# Patient Record
Sex: Female | Born: 1975 | Race: White | Hispanic: No | Marital: Married | Smoking: Never smoker
Health system: Southern US, Community
[De-identification: ages and names within clinical notes are randomized; demographics above are authoritative.]

## PROBLEM LIST (undated history)

## (undated) ENCOUNTER — Inpatient Hospital Stay (HOSPITAL_COMMUNITY): Payer: Self-pay

## (undated) DIAGNOSIS — G8929 Other chronic pain: Secondary | ICD-10-CM

## (undated) DIAGNOSIS — I8390 Asymptomatic varicose veins of unspecified lower extremity: Secondary | ICD-10-CM

## (undated) DIAGNOSIS — R51 Headache: Principal | ICD-10-CM

## (undated) DIAGNOSIS — R519 Headache, unspecified: Secondary | ICD-10-CM

## (undated) HISTORY — DX: Asymptomatic varicose veins of unspecified lower extremity: I83.90

## (undated) HISTORY — DX: Other chronic pain: G89.29

## (undated) HISTORY — DX: Headache: R51

## (undated) HISTORY — DX: Headache, unspecified: R51.9

---
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
## 2006-01-14 ENCOUNTER — Inpatient Hospital Stay: Payer: Self-pay | Admitting: Unknown Physician Specialty

## 2007-08-18 ENCOUNTER — Observation Stay: Payer: Self-pay | Admitting: Obstetrics and Gynecology

## 2007-08-24 ENCOUNTER — Inpatient Hospital Stay: Payer: Self-pay

## 2010-05-24 ENCOUNTER — Inpatient Hospital Stay: Payer: Self-pay | Admitting: Obstetrics & Gynecology

## 2011-02-25 ENCOUNTER — Ambulatory Visit (INDEPENDENT_AMBULATORY_CARE_PROVIDER_SITE_OTHER): Payer: Commercial Managed Care - PPO | Admitting: *Deleted

## 2011-02-25 VITALS — BP 110/61 | Temp 98.6°F | Ht 71.0 in | Wt 186.0 lb

## 2011-02-25 DIAGNOSIS — Z3480 Encounter for supervision of other normal pregnancy, unspecified trimester: Secondary | ICD-10-CM

## 2011-02-25 NOTE — Progress Notes (Signed)
NOB intake with pt.  Pt is declining genetic testing at this point.  The only c/o she has is with all of her pregnancies she has an increase in headaches.  Prenatal labs drawn today per protocol and bedside u/s showed positive FHT and CRl measures  at 12wk 2 days.  Pt to return next week for her prenatal exam.

## 2011-02-26 LAB — OBSTETRIC PANEL
Antibody Screen: NEGATIVE
Basophils Relative: 0 % (ref 0–1)
Eosinophils Absolute: 0.1 10*3/uL (ref 0.0–0.7)
Eosinophils Relative: 1 % (ref 0–5)
Hemoglobin: 12.9 g/dL (ref 12.0–15.0)
Hepatitis B Surface Ag: NEGATIVE
Lymphs Abs: 1.6 10*3/uL (ref 0.7–4.0)
MCH: 29.7 pg (ref 26.0–34.0)
MCHC: 33 g/dL (ref 30.0–36.0)
MCV: 89.9 fL (ref 78.0–100.0)
Monocytes Absolute: 0.4 10*3/uL (ref 0.1–1.0)
Monocytes Relative: 6 % (ref 3–12)
Neutrophils Relative %: 70 % (ref 43–77)
Rh Type: POSITIVE

## 2011-02-26 LAB — HIV ANTIBODY (ROUTINE TESTING W REFLEX): HIV: NONREACTIVE

## 2011-03-01 LAB — CULTURE, URINE COMPREHENSIVE: Colony Count: 3000

## 2011-03-05 ENCOUNTER — Ambulatory Visit (INDEPENDENT_AMBULATORY_CARE_PROVIDER_SITE_OTHER): Payer: Commercial Managed Care - PPO | Admitting: Family

## 2011-03-05 DIAGNOSIS — G8929 Other chronic pain: Secondary | ICD-10-CM

## 2011-03-05 DIAGNOSIS — Z3480 Encounter for supervision of other normal pregnancy, unspecified trimester: Secondary | ICD-10-CM

## 2011-03-05 DIAGNOSIS — R51 Headache: Secondary | ICD-10-CM

## 2011-03-05 DIAGNOSIS — R519 Headache, unspecified: Secondary | ICD-10-CM | POA: Insufficient documentation

## 2011-03-05 DIAGNOSIS — IMO0002 Reserved for concepts with insufficient information to code with codable children: Secondary | ICD-10-CM

## 2011-03-05 DIAGNOSIS — I8390 Asymptomatic varicose veins of unspecified lower extremity: Secondary | ICD-10-CM

## 2011-03-05 DIAGNOSIS — O09529 Supervision of elderly multigravida, unspecified trimester: Secondary | ICD-10-CM

## 2011-03-05 DIAGNOSIS — Z1272 Encounter for screening for malignant neoplasm of vagina: Secondary | ICD-10-CM

## 2011-03-05 DIAGNOSIS — Z113 Encounter for screening for infections with a predominantly sexual mode of transmission: Secondary | ICD-10-CM

## 2011-03-05 MED ORDER — BUTALBITAL-APAP-CAFFEINE 50-325-40 MG PO TABS: 1.0000 | ORAL_TABLET | Freq: Four times a day (QID) | ORAL | Status: DC | PRN

## 2011-03-05 NOTE — Progress Notes (Signed)
Exam  Reviewed genetic screening>declined; prefers to get a detailed ultrasound.  Uterine Size: size equals dates  Pelvic Exam:    Perineum: Hemorrhoids, Normal Perineum   Vulva: normal   Vagina:  normal mucosa, normal discharge, no palpable nodules   pH: Not done   Cervix: no bleeding following Pap, no cervical motion tenderness and no lesions   Adnexa: normal adnexa and no mass, fullness, tenderness   Bony Pelvis: Adequate  System: Breast:  No nipple retraction or dimpling, No nipple discharge or bleeding, No axillary or supraclavicular adenopathy, Normal to palpation without dominant masses   Skin: normal coloration and turgor, no rashes    Neurologic: negative   Extremities: normal strength, tone, and muscle mass   HEENT neck supple with midline trachea and thyroid without masses   Mouth/Teeth mucous membranes moist, pharynx normal without lesions   Neck supple and no masses   Cardiovascular: regular rate and rhythm, no murmurs or gallops   Respiratory:  appears well, vitals normal, no respiratory distress, acyanotic, normal RR, neck free of mass or lymphadenopathy, chest clear, no wheezing, crepitations, rhonchi, normal symmetric air entry   Abdomen: soft, non-tender; bowel sounds normal; no masses,  no organomegaly   Urinary: urethral meatus normal

## 2011-03-12 ENCOUNTER — Telehealth: Payer: Self-pay | Admitting: Emergency Medicine

## 2011-03-12 NOTE — Telephone Encounter (Signed)
Spoke with [name].  She states that she began with head cold like symptoms on Tuesday.  She has sinus congestion, teeth hurt.  She has had headache since Wednesday that will not get better.  She states that she has tried taking Tylenol which did not help.  She did take Excedrin which was the only thing that helped her HA temporarily.  She denies any cough or fevers.  She has not really been able to get much to come out.  Would like to know if CNM will call in abx for ?sinus infection.  Per [place], likely viral at this point.  [place] recommends ibuprofen 600mg  q 6 hrs PRN headache, Mucinex BID and increase water intake.  If symptoms no better by Monday, Katie Hurley is to call back for appointment.  She is agreeable to instructions and verbalizes understanding.

## 2011-03-17 ENCOUNTER — Telehealth: Payer: Self-pay | Admitting: Emergency Medicine

## 2011-03-17 NOTE — Telephone Encounter (Signed)
Spoke with pt.  She states that her sinus problems are continuing.  She has been taking Mucinex as directed per [place], [place].  She states Mucinex seems to dry out her sinuses.  She is able to get more out than she was.  Mucous is yellowish.  Advised her to try saline nose spray, common cold can last 7-10 days.  Cautioned on s/s of infection.  Offered appt tomorrow pm, but pt declines for now.  "Will wait it out two weeks and see how I feel".

## 2011-03-18 ENCOUNTER — Ambulatory Visit: Payer: Commercial Managed Care - PPO | Admitting: Physician Assistant

## 2011-03-18 MED ORDER — FLUTICASONE PROPIONATE 50 MCG/ACT NA SUSP: 2.0000 | Freq: Every day | NASAL | Status: DC

## 2011-03-18 MED ORDER — PENICILLIN V POTASSIUM 500 MG PO TABS: 500.0000 mg | ORAL_TABLET | Freq: Two times a day (BID) | ORAL | Status: AC

## 2011-03-18 NOTE — Progress Notes (Signed)
P=77  Sinus pressure and teeth achey  T=95.8

## 2011-04-09 ENCOUNTER — Ambulatory Visit (INDEPENDENT_AMBULATORY_CARE_PROVIDER_SITE_OTHER): Payer: Commercial Managed Care - PPO | Admitting: Family

## 2011-04-09 VITALS — BP 113/62 | Temp 98.5°F | Wt 196.0 lb

## 2011-04-09 DIAGNOSIS — O98919 Unspecified maternal infectious and parasitic disease complicating pregnancy, unspecified trimester: Secondary | ICD-10-CM

## 2011-04-09 DIAGNOSIS — Z3480 Encounter for supervision of other normal pregnancy, unspecified trimester: Secondary | ICD-10-CM

## 2011-04-09 DIAGNOSIS — IMO0002 Reserved for concepts with insufficient information to code with codable children: Secondary | ICD-10-CM

## 2011-04-09 NOTE — Progress Notes (Signed)
Doing well; no questions or concerns; sinuses have improved; "lot more round ligament pain'; will schedule ultrasound

## 2011-04-09 NOTE — Progress Notes (Signed)
Increase in round ligament pain  p-87

## 2011-04-15 ENCOUNTER — Ambulatory Visit (HOSPITAL_COMMUNITY)
Admission: RE | Admit: 2011-04-15 | Discharge: 2011-04-15 | Disposition: A | Discharge status: Home / Self Care | Payer: Commercial Managed Care - PPO | Source: Ambulatory Visit | Attending: Obstetrics & Gynecology | Admitting: Obstetrics & Gynecology

## 2011-04-15 DIAGNOSIS — Z363 Encounter for antenatal screening for malformations: Secondary | ICD-10-CM | POA: Insufficient documentation

## 2011-04-15 DIAGNOSIS — O09529 Supervision of elderly multigravida, unspecified trimester: Secondary | ICD-10-CM | POA: Insufficient documentation

## 2011-04-15 DIAGNOSIS — Z1389 Encounter for screening for other disorder: Secondary | ICD-10-CM | POA: Insufficient documentation

## 2011-04-15 DIAGNOSIS — O358XX0 Maternal care for other (suspected) fetal abnormality and damage, not applicable or unspecified: Secondary | ICD-10-CM | POA: Insufficient documentation

## 2011-04-15 DIAGNOSIS — O98919 Unspecified maternal infectious and parasitic disease complicating pregnancy, unspecified trimester: Secondary | ICD-10-CM

## 2011-05-07 ENCOUNTER — Encounter: Payer: Self-pay | Admitting: Advanced Practice Midwife

## 2011-05-07 ENCOUNTER — Ambulatory Visit (INDEPENDENT_AMBULATORY_CARE_PROVIDER_SITE_OTHER): Payer: Commercial Managed Care - PPO | Admitting: Advanced Practice Midwife

## 2011-05-07 DIAGNOSIS — I8390 Asymptomatic varicose veins of unspecified lower extremity: Secondary | ICD-10-CM

## 2011-05-07 DIAGNOSIS — Z3480 Encounter for supervision of other normal pregnancy, unspecified trimester: Secondary | ICD-10-CM

## 2011-05-07 NOTE — Progress Notes (Signed)
p-82  Leg pain due to varicosities

## 2011-05-07 NOTE — Progress Notes (Signed)
Doing well except increased discomfort with varicosity on right leg and thigh. Encouraged use of support hose and elevation of legs. Discussed fetal movement and PTL precautions.

## 2011-06-04 ENCOUNTER — Ambulatory Visit (INDEPENDENT_AMBULATORY_CARE_PROVIDER_SITE_OTHER): Payer: Commercial Managed Care - PPO | Admitting: Family

## 2011-06-04 VITALS — BP 109/63 | Temp 97.6°F | Wt 209.0 lb

## 2011-06-04 DIAGNOSIS — IMO0002 Reserved for concepts with insufficient information to code with codable children: Secondary | ICD-10-CM

## 2011-06-04 DIAGNOSIS — O09529 Supervision of elderly multigravida, unspecified trimester: Secondary | ICD-10-CM

## 2011-06-04 DIAGNOSIS — Z3480 Encounter for supervision of other normal pregnancy, unspecified trimester: Secondary | ICD-10-CM

## 2011-06-04 MED ORDER — CYCLOBENZAPRINE HCL 10 MG PO TABS: 10.0000 mg | ORAL_TABLET | Freq: Three times a day (TID) | ORAL | Status: AC | PRN

## 2011-06-04 NOTE — Progress Notes (Signed)
Reports increased back pain possibly from car ride over Thanksgiving; no UTI symptoms; desires possible induction at 40.3wks due to the increased pain she has with varicose veins; right leg varicose vein increasing in size>RX for compression hose; Flexeril for back pain; will come back for 1 hr GCT and labs.

## 2011-06-04 NOTE — Progress Notes (Signed)
p-84  Needs a RX for compression hose.  C/O's lower back pain. Urine is clear

## 2011-06-11 ENCOUNTER — Other Ambulatory Visit: Payer: Commercial Managed Care - PPO

## 2011-06-11 ENCOUNTER — Telehealth: Payer: Self-pay | Admitting: *Deleted

## 2011-06-11 DIAGNOSIS — J329 Chronic sinusitis, unspecified: Secondary | ICD-10-CM

## 2011-06-11 MED ORDER — AMOXICILLIN 500 MG PO CAPS: 500.0000 mg | ORAL_CAPSULE | Freq: Three times a day (TID) | ORAL | Status: AC

## 2011-06-11 NOTE — Telephone Encounter (Signed)
Pt called with c/o's sinus infection, blowing green and yellow mucus.  Spoke with Deirdre Poe CNM who ordered Amoxicillin 500mg  TID x 7 days.  This was sent to [facility].

## 2011-06-18 ENCOUNTER — Ambulatory Visit (INDEPENDENT_AMBULATORY_CARE_PROVIDER_SITE_OTHER): Payer: Commercial Managed Care - PPO | Admitting: Family

## 2011-06-18 VITALS — BP 117/75 | Temp 97.0°F | Wt 210.0 lb

## 2011-06-18 DIAGNOSIS — Z3480 Encounter for supervision of other normal pregnancy, unspecified trimester: Secondary | ICD-10-CM

## 2011-06-18 LAB — CBC
HCT: 36.5 % (ref 36.0–46.0)
MCHC: 35.6 g/dL (ref 30.0–36.0)
MCV: 90.1 fL (ref 78.0–100.0)
Platelets: 218 10*3/uL (ref 150–400)
RDW: 14.2 % (ref 11.5–15.5)
WBC: 8.1 10*3/uL (ref 4.0–10.5)

## 2011-06-18 NOTE — Progress Notes (Signed)
p-100 Pt did get her compression hose which are helping.

## 2011-06-18 NOTE — Progress Notes (Signed)
Purchased support hose, reports increased comfort and decreased swelling; asked regarding possible induction due to pain>consulted with Dr. Jearld Adjutant vein pain is not indication for IOL>pt informed; 1 hr GCT and 3rd trimester labs today.

## 2011-06-19 LAB — HIV ANTIBODY (ROUTINE TESTING W REFLEX): HIV: NONREACTIVE

## 2011-06-19 LAB — GLUCOSE TOLERANCE, 1 HOUR: Glucose, 1 Hour GTT: 55 mg/dL — ABNORMAL LOW (ref 70–140)

## 2011-07-01 ENCOUNTER — Ambulatory Visit (INDEPENDENT_AMBULATORY_CARE_PROVIDER_SITE_OTHER): Payer: Commercial Managed Care - PPO | Admitting: Physician Assistant

## 2011-07-01 DIAGNOSIS — Z3480 Encounter for supervision of other normal pregnancy, unspecified trimester: Secondary | ICD-10-CM

## 2011-07-01 NOTE — Progress Notes (Signed)
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 No s/s DVT, precautions reviewed.

## 2011-07-01 NOTE — Patient Instructions (Signed)
Pregnancy - Third Trimester The third trimester of pregnancy (the last 3 months) is a period of the most rapid growth for you and your baby. The baby approaches a length of 20 inches and a weight of 6 to 10 pounds. The baby is adding on fat and getting ready for life outside your body. While inside, babies have periods of sleeping and waking, suck their thumbs, and hiccups. You can often feel small contractions of the uterus. This is false labor. It is also called Braxton-Hicks contractions. This is like a practice for labor. The usual problems in this stage of pregnancy include more difficulty breathing, swelling of the hands and feet from water retention, and having to urinate more often because of the uterus and baby pressing on your bladder.  PRENATAL EXAMS  Blood work may continue to be done during prenatal exams. These tests are done to check on your health and the probable health of your baby. Blood work is used to follow your blood levels (hemoglobin). Anemia (low hemoglobin) is common during pregnancy. Iron and vitamins are given to help prevent this. You may also continue to be checked for diabetes. Some of the past blood tests may be done again.   The size of the uterus is measured during each visit. This makes sure your baby is growing properly according to your pregnancy dates.   Your blood pressure is checked every prenatal visit. This is to make sure you are not getting toxemia.   Your urine is checked every prenatal visit for infection, diabetes and protein.   Your weight is checked at each visit. This is done to make sure gains are happening at the suggested rate and that you and your baby are growing normally.   Sometimes, an ultrasound is performed to confirm the position and the proper growth and development of the baby. This is a test done that bounces harmless sound waves off the baby so your caregiver can more accurately determine due dates.   Discuss the type of pain  medication and anesthesia you will have during your labor and delivery.   Discuss the possibility and anesthesia if a Cesarean Section might be necessary.   Inform your caregiver if there is any mental or physical violence at home.  Sometimes, a specialized non-stress test, contraction stress test and biophysical profile are done to make sure the baby is not having a problem. Checking the amniotic fluid surrounding the baby is called an amniocentesis. The amniotic fluid is removed by sticking a needle into the belly (abdomen). This is sometimes done near the end of pregnancy if an early delivery is required. In this case, it is done to help make sure the baby's lungs are mature enough for the baby to live outside of the womb. If the lungs are not mature and it is unsafe to deliver the baby, an injection of cortisone medication is given to the mother 1 to 2 days before the delivery. This helps the baby's lungs mature and makes it safer to deliver the baby. CHANGES OCCURING IN THE THIRD TRIMESTER OF PREGNANCY Your body goes through many changes during pregnancy. They vary from person to person. Talk to your caregiver about changes you notice and are concerned about.  During the last trimester, you have probably had an increase in your appetite. It is normal to have cravings for certain foods. This varies from person to person and pregnancy to pregnancy.   You may begin to get stretch marks on your hips,   abdomen, and breasts. These are normal changes in the body during pregnancy. There are no exercises or medications to take which prevent this change.   Constipation may be treated with a stool softener or adding bulk to your diet. Drinking lots of fluids, fiber in vegetables, fruits, and whole grains are helpful.   Exercising is also helpful. If you have been very active up until your pregnancy, most of these activities can be continued during your pregnancy. If you have been less active, it is helpful  to start an exercise program such as walking. Consult your caregiver before starting exercise programs.   Avoid all smoking, alcohol, un-prescribed drugs, herbs and "street drugs" during your pregnancy. These chemicals affect the formation and growth of the baby. Avoid chemicals throughout the pregnancy to ensure the delivery of a healthy infant.   Backache, varicose veins and hemorrhoids may develop or get worse.   You will tire more easily in the third trimester, which is normal.   The baby's movements may be stronger and more often.   You may become short of breath easily.   Your belly button may stick out.   A yellow discharge may leak from your breasts called colostrum.   You may have a bloody mucus discharge. This usually occurs a few days to a week before labor begins.  HOME CARE INSTRUCTIONS   Keep your caregiver's appointments. Follow your caregiver's instructions regarding medication use, exercise, and diet.   During pregnancy, you are providing food for you and your baby. Continue to eat regular, well-balanced meals. Choose foods such as meat, fish, milk and other low fat dairy products, vegetables, fruits, and whole-grain breads and cereals. Your caregiver will tell you of the ideal weight gain.   A physical sexual relationship may be continued throughout pregnancy if there are no other problems such as early (premature) leaking of amniotic fluid from the membranes, vaginal bleeding, or belly (abdominal) pain.   Exercise regularly if there are no restrictions. Check with your caregiver if you are unsure of the safety of your exercises. Greater weight gain will occur in the last 2 trimesters of pregnancy. Exercising helps:   Control your weight.   Get you in shape for labor and delivery.   You lose weight after you deliver.   Rest a lot with legs elevated, or as needed for leg cramps or low back pain.   Wear a good support or jogging bra for breast tenderness during  pregnancy. This may help if worn during sleep. Pads or tissues may be used in the bra if you are leaking colostrum.   Do not use hot tubs, steam rooms, or saunas.   Wear your seat belt when driving. This protects you and your baby if you are in an accident.   Avoid raw meat, cat litter boxes and soil used by cats. These carry germs that can cause birth defects in the baby.   It is easier to loose urine during pregnancy. Tightening up and strengthening the pelvic muscles will help with this problem. You can practice stopping your urination while you are going to the bathroom. These are the same muscles you need to strengthen. It is also the muscles you would use if you were trying to stop from passing gas. You can practice tightening these muscles up 10 times a set and repeating this about 3 times per day. Once you know what muscles to tighten up, do not perform these exercises during urination. It is more likely   to cause an infection by backing up the urine.   Ask for help if you have financial, counseling or nutritional needs during pregnancy. Your caregiver will be able to offer counseling for these needs as well as refer you for other special needs.   Make a list of emergency phone numbers and have them available.   Plan on getting help from family or friends when you go home from the hospital.   Make a trial run to the hospital.   Take prenatal classes with the father to understand, practice and ask questions about the labor and delivery.   Prepare the baby's room/nursery.   Do not travel out of the city unless it is absolutely necessary and with the advice of your caregiver.   Wear only low or no heal shoes to have better balance and prevent falling.  MEDICATIONS AND DRUG USE IN PREGNANCY  Take prenatal vitamins as directed. The vitamin should contain 1 milligram of folic acid. Keep all vitamins out of reach of children. Only a couple vitamins or tablets containing iron may be fatal  to a baby or young child when ingested.   Avoid use of all medications, including herbs, over-the-counter medications, not prescribed or suggested by your caregiver. Only take over-the-counter or prescription medicines for pain, discomfort, or fever as directed by your caregiver. Do not use aspirin, ibuprofen (Motrin, Advil, Nuprin) or naproxen (Aleve) unless OK'd by your caregiver.   Let your caregiver also know about herbs you may be using.   Alcohol is related to a number of birth defects. This includes fetal alcohol syndrome. All alcohol, in any form, should be avoided completely. Smoking will cause low birth rate and premature babies.   Street/illegal drugs are very harmful to the baby. They are absolutely forbidden. A baby born to an addicted mother will be addicted at birth. The baby will go through the same withdrawal an adult does.  SEEK MEDICAL CARE IF: You have any concerns or worries during your pregnancy. It is better to call with your questions if you feel they cannot wait, rather than worry about them. DECISIONS ABOUT CIRCUMCISION You may or may not know the sex of your baby. If you know your baby is a boy, it may be time to think about circumcision. Circumcision is the removal of the foreskin of the penis. This is the skin that covers the sensitive end of the penis. There is no proven medical need for this. Often this decision is made on what is popular at the time or based upon religious beliefs and social issues. You can discuss these issues with your caregiver or pediatrician. SEEK IMMEDIATE MEDICAL CARE IF:   An unexplained oral temperature above 102 F (38.9 C) develops, or as your caregiver suggests.   You have leaking of fluid from the vagina (birth canal). If leaking membranes are suspected, take your temperature and tell your caregiver of this when you call.   There is vaginal spotting, bleeding or passing clots. Tell your caregiver of the amount and how many pads are  used.   You develop a bad smelling vaginal discharge with a change in the color from clear to white.   You develop vomiting that lasts more than 24 hours.   You develop chills or fever.   You develop shortness of breath.   You develop burning on urination.   You loose more than 2 pounds of weight or gain more than 2 pounds of weight or as suggested by your   caregiver.   You notice sudden swelling of your face, hands, and feet or legs.   You develop belly (abdominal) pain. Round ligament discomfort is a common non-cancerous (benign) cause of abdominal pain in pregnancy. Your caregiver still must evaluate you.   You develop a severe headache that does not go away.   You develop visual problems, blurred or double vision.   If you have not felt your baby move for more than 1 hour. If you think the baby is not moving as much as usual, eat something with sugar in it and lie down on your left side for an hour. The baby should move at least 4 to 5 times per hour. Call right away if your baby moves less than that.   You fall, are in a car accident or any kind of trauma.   There is mental or physical violence at home.  Document Released: 06/15/2001 Document Revised: 03/03/2011 Document Reviewed: 12/18/2008 Exit[employer] Patient Information 2012 [employer]. 

## 2011-07-06 NOTE — L&D Delivery Note (Signed)
Delivery Note At 8:56 AM a viable female was delivered via Vaginal, Spontaneous Delivery (Presentation: Left Occiput Anterior).  APGAR: 8, 8; weight 9 lb 13 oz (4451 g).   Placenta status: Intact, Spontaneous.  Cord: 3 vessels with the following complications: Long, body cord.    Mild postpartum hemorrhage - cytotec PO given.  Anesthesia: Epidural  Episiotomy: None Lacerations: None Est. Blood Loss (mL): 800  Mom to postpartum.  Baby to nursery-stable.  Melven Stockard JEHIEL 09/09/2011, 11:46 AM

## 2011-07-26 ENCOUNTER — Ambulatory Visit (INDEPENDENT_AMBULATORY_CARE_PROVIDER_SITE_OTHER): Payer: Commercial Managed Care - PPO | Admitting: Advanced Practice Midwife

## 2011-07-26 ENCOUNTER — Encounter: Payer: Self-pay | Admitting: Advanced Practice Midwife

## 2011-07-26 VITALS — BP 113/70 | Temp 87.2°F | Wt 215.0 lb

## 2011-07-26 DIAGNOSIS — O09529 Supervision of elderly multigravida, unspecified trimester: Secondary | ICD-10-CM

## 2011-07-26 MED ORDER — TRIAMCINOLONE ACETONIDE 0.5 % EX OINT: TOPICAL_OINTMENT | Freq: Two times a day (BID) | CUTANEOUS | Status: DC

## 2011-07-26 NOTE — Progress Notes (Signed)
Well except itchy rash on right arm and left elbow, multiple small excoriated papular lesions, aquaphor helps a little. Rx triamcinolone cream. Rev'd precautions, GBS at next visit.

## 2011-07-26 NOTE — Patient Instructions (Signed)
Pregnancy - Third Trimester The third trimester begins at the 28th week of pregnancy and ends at birth. It is important to follow your doctor's instructions. HOME CARE   Go to your doctor's visits.   Do not smoke.   Do not drink alcohol or use drugs.   Only take medicine as told by your doctor.   Take prenatal vitamins as told. The vitamin should contain 1 milligram of folic acid.   Exercise.   Eat healthy foods. Eat regular, well-balanced meals.   You can have sex (intercourse) if there are no other problems with the pregnancy.   Do not use hot tubs, steam rooms, or saunas.   Wear a seat belt while driving.   Avoid raw meat, uncooked cheese, and litter boxes and soil used by cats.   Rest with your legs raised (elevated).   Make a list of emergency phone numbers. Keep this list with you.   Arrange for help when you come back home after delivering the baby.   Make a trial run to the hospital.   Take prenatal classes.   Prepare the baby's nursery.   Do not travel out of the city. If you absolutely have to, get permission from your doctor first.   Wear flat shoes. Do not wear high heels.  GET HELP RIGHT AWAY IF:   You have a temperature by mouth above 102 F (38.9 C), not controlled by medicine.   You have not felt the baby move for more than 1 hour. If you think the baby is not moving as much as normal, eat something with sugar in it or lie down on your left side for an hour. The baby should move at least 4 to 5 times per hour.   Fluid is coming from the vagina.   Blood is coming from the vagina. Light spotting is common, especially after sex (intercourse).   You have belly (abdominal) pain.   You have a bad smelling fluid (discharge) coming from the vagina. The fluid changes from clear to white.   You still feel sick to your stomach (nauseous).   You throw up (vomit) for more than 24 hours.   You have the chills.   You have shortness of breath.   You  have a burning feeling when you pee (urinate).   You lose or gain more than 2 pounds (0.9 kilograms) of weight over a week, or as told by your doctor.   Your face, hands, feet, or legs get puffy (swell).   You have a bad headache that will not go away.   You start to have problems seeing (blurry or double vision).   You fall, are in a car accident, or have any kind of trauma.   There is mental or physical violence at home.   You have any concerns or worries during your pregnancy.  MAKE SURE YOU:   Understand these instructions.   Will watch your condition.   Will get help right away if you are not doing well or get worse.  Document Released: 09/15/2009 Document Revised: 03/03/2011 Document Reviewed: 09/15/2009 Exit[employer] Patient Information 2012 [employer]. 

## 2011-07-26 NOTE — Progress Notes (Signed)
p-82  Pt requesting something for the itchy rash on rt forearm that has only been there since pregancy

## 2011-08-09 ENCOUNTER — Ambulatory Visit (INDEPENDENT_AMBULATORY_CARE_PROVIDER_SITE_OTHER): Payer: Commercial Managed Care - PPO | Admitting: Advanced Practice Midwife

## 2011-08-09 VITALS — BP 121/69 | Temp 97.0°F | Wt 219.0 lb

## 2011-08-09 DIAGNOSIS — Z3490 Encounter for supervision of normal pregnancy, unspecified, unspecified trimester: Secondary | ICD-10-CM

## 2011-08-09 DIAGNOSIS — Z3480 Encounter for supervision of other normal pregnancy, unspecified trimester: Secondary | ICD-10-CM

## 2011-08-09 LAB — GC/CHLAMYDIA PROBE AMP, GENITAL: Chlamydia: NEGATIVE

## 2011-08-09 LAB — STREP B DNA PROBE: GBS: NEGATIVE

## 2011-08-09 NOTE — Patient Instructions (Signed)
Pregnancy - Third Trimester The third trimester of pregnancy (the last 3 months) is a period of the most rapid growth for you and your baby. The baby approaches a length of 20 inches and a weight of 6 to 10 pounds. The baby is adding on fat and getting ready for life outside your body. While inside, babies have periods of sleeping and waking, suck their thumbs, and hiccups. You can often feel small contractions of the uterus. This is false labor. It is also called Braxton-Hicks contractions. This is like a practice for labor. The usual problems in this stage of pregnancy include more difficulty breathing, swelling of the hands and feet from water retention, and having to urinate more often because of the uterus and baby pressing on your bladder.  PRENATAL EXAMS  Blood work may continue to be done during prenatal exams. These tests are done to check on your health and the probable health of your baby. Blood work is used to follow your blood levels (hemoglobin). Anemia (low hemoglobin) is common during pregnancy. Iron and vitamins are given to help prevent this. You may also continue to be checked for diabetes. Some of the past blood tests may be done again.   The size of the uterus is measured during each visit. This makes sure your baby is growing properly according to your pregnancy dates.   Your blood pressure is checked every prenatal visit. This is to make sure you are not getting toxemia.   Your urine is checked every prenatal visit for infection, diabetes and protein.   Your weight is checked at each visit. This is done to make sure gains are happening at the suggested rate and that you and your baby are growing normally.   Sometimes, an ultrasound is performed to confirm the position and the proper growth and development of the baby. This is a test done that bounces harmless sound waves off the baby so your caregiver can more accurately determine due dates.   Discuss the type of pain  medication and anesthesia you will have during your labor and delivery.   Discuss the possibility and anesthesia if a Cesarean Section might be necessary.   Inform your caregiver if there is any mental or physical violence at home.  Sometimes, a specialized non-stress test, contraction stress test and biophysical profile are done to make sure the baby is not having a problem. Checking the amniotic fluid surrounding the baby is called an amniocentesis. The amniotic fluid is removed by sticking a needle into the belly (abdomen). This is sometimes done near the end of pregnancy if an early delivery is required. In this case, it is done to help make sure the baby's lungs are mature enough for the baby to live outside of the womb. If the lungs are not mature and it is unsafe to deliver the baby, an injection of cortisone medication is given to the mother 1 to 2 days before the delivery. This helps the baby's lungs mature and makes it safer to deliver the baby. CHANGES OCCURING IN THE THIRD TRIMESTER OF PREGNANCY Your body goes through many changes during pregnancy. They vary from person to person. Talk to your caregiver about changes you notice and are concerned about.  During the last trimester, you have probably had an increase in your appetite. It is normal to have cravings for certain foods. This varies from person to person and pregnancy to pregnancy.   You may begin to get stretch marks on your hips,   abdomen, and breasts. These are normal changes in the body during pregnancy. There are no exercises or medications to take which prevent this change.   Constipation may be treated with a stool softener or adding bulk to your diet. Drinking lots of fluids, fiber in vegetables, fruits, and whole grains are helpful.   Exercising is also helpful. If you have been very active up until your pregnancy, most of these activities can be continued during your pregnancy. If you have been less active, it is helpful  to start an exercise program such as walking. Consult your caregiver before starting exercise programs.   Avoid all smoking, alcohol, un-prescribed drugs, herbs and "street drugs" during your pregnancy. These chemicals affect the formation and growth of the baby. Avoid chemicals throughout the pregnancy to ensure the delivery of a healthy infant.   Backache, varicose veins and hemorrhoids may develop or get worse.   You will tire more easily in the third trimester, which is normal.   The baby's movements may be stronger and more often.   You may become short of breath easily.   Your belly button may stick out.   A yellow discharge may leak from your breasts called colostrum.   You may have a bloody mucus discharge. This usually occurs a few days to a week before labor begins.  HOME CARE INSTRUCTIONS   Keep your caregiver's appointments. Follow your caregiver's instructions regarding medication use, exercise, and diet.   During pregnancy, you are providing food for you and your baby. Continue to eat regular, well-balanced meals. Choose foods such as meat, fish, milk and other low fat dairy products, vegetables, fruits, and whole-grain breads and cereals. Your caregiver will tell you of the ideal weight gain.   A physical sexual relationship may be continued throughout pregnancy if there are no other problems such as early (premature) leaking of amniotic fluid from the membranes, vaginal bleeding, or belly (abdominal) pain.   Exercise regularly if there are no restrictions. Check with your caregiver if you are unsure of the safety of your exercises. Greater weight gain will occur in the last 2 trimesters of pregnancy. Exercising helps:   Control your weight.   Get you in shape for labor and delivery.   You lose weight after you deliver.   Rest a lot with legs elevated, or as needed for leg cramps or low back pain.   Wear a good support or jogging bra for breast tenderness during  pregnancy. This may help if worn during sleep. Pads or tissues may be used in the bra if you are leaking colostrum.   Do not use hot tubs, steam rooms, or saunas.   Wear your seat belt when driving. This protects you and your baby if you are in an accident.   Avoid raw meat, cat litter boxes and soil used by cats. These carry germs that can cause birth defects in the baby.   It is easier to loose urine during pregnancy. Tightening up and strengthening the pelvic muscles will help with this problem. You can practice stopping your urination while you are going to the bathroom. These are the same muscles you need to strengthen. It is also the muscles you would use if you were trying to stop from passing gas. You can practice tightening these muscles up 10 times a set and repeating this about 3 times per day. Once you know what muscles to tighten up, do not perform these exercises during urination. It is more likely   to cause an infection by backing up the urine.   Ask for help if you have financial, counseling or nutritional needs during pregnancy. Your caregiver will be able to offer counseling for these needs as well as refer you for other special needs.   Make a list of emergency phone numbers and have them available.   Plan on getting help from family or friends when you go home from the hospital.   Make a trial run to the hospital.   Take prenatal classes with the father to understand, practice and ask questions about the labor and delivery.   Prepare the baby's room/nursery.   Do not travel out of the city unless it is absolutely necessary and with the advice of your caregiver.   Wear only low or no heal shoes to have better balance and prevent falling.  MEDICATIONS AND DRUG USE IN PREGNANCY  Take prenatal vitamins as directed. The vitamin should contain 1 milligram of folic acid. Keep all vitamins out of reach of children. Only a couple vitamins or tablets containing iron may be fatal  to a baby or young child when ingested.   Avoid use of all medications, including herbs, over-the-counter medications, not prescribed or suggested by your caregiver. Only take over-the-counter or prescription medicines for pain, discomfort, or fever as directed by your caregiver. Do not use aspirin, ibuprofen (Motrin, Advil, Nuprin) or naproxen (Aleve) unless OK'd by your caregiver.   Let your caregiver also know about herbs you may be using.   Alcohol is related to a number of birth defects. This includes fetal alcohol syndrome. All alcohol, in any form, should be avoided completely. Smoking will cause low birth rate and premature babies.   Street/illegal drugs are very harmful to the baby. They are absolutely forbidden. A baby born to an addicted mother will be addicted at birth. The baby will go through the same withdrawal an adult does.  SEEK MEDICAL CARE IF: You have any concerns or worries during your pregnancy. It is better to call with your questions if you feel they cannot wait, rather than worry about them. DECISIONS ABOUT CIRCUMCISION You may or may not know the sex of your baby. If you know your baby is a boy, it may be time to think about circumcision. Circumcision is the removal of the foreskin of the penis. This is the skin that covers the sensitive end of the penis. There is no proven medical need for this. Often this decision is made on what is popular at the time or based upon religious beliefs and social issues. You can discuss these issues with your caregiver or pediatrician. SEEK IMMEDIATE MEDICAL CARE IF:   An unexplained oral temperature above 102 F (38.9 C) develops, or as your caregiver suggests.   You have leaking of fluid from the vagina (birth canal). If leaking membranes are suspected, take your temperature and tell your caregiver of this when you call.   There is vaginal spotting, bleeding or passing clots. Tell your caregiver of the amount and how many pads are  used.   You develop a bad smelling vaginal discharge with a change in the color from clear to white.   You develop vomiting that lasts more than 24 hours.   You develop chills or fever.   You develop shortness of breath.   You develop burning on urination.   You loose more than 2 pounds of weight or gain more than 2 pounds of weight or as suggested by your   caregiver.   You notice sudden swelling of your face, hands, and feet or legs.   You develop belly (abdominal) pain. Round ligament discomfort is a common non-cancerous (benign) cause of abdominal pain in pregnancy. Your caregiver still must evaluate you.   You develop a severe headache that does not go away.   You develop visual problems, blurred or double vision.   If you have not felt your baby move for more than 1 hour. If you think the baby is not moving as much as usual, eat something with sugar in it and lie down on your left side for an hour. The baby should move at least 4 to 5 times per hour. Call right away if your baby moves less than that.   You fall, are in a car accident or any kind of trauma.   There is mental or physical violence at home.  Document Released: 06/15/2001 Document Revised: 03/03/2011 Document Reviewed: 12/18/2008 Exit[employer] Patient Information 2012 [employer]. 

## 2011-08-09 NOTE — Progress Notes (Signed)
p=86 

## 2011-08-09 NOTE — Progress Notes (Signed)
Well - did not get rx for triamcinolone, but got 1% hydrocortisone and is not helping, rash not worsening, will try oral antihistamines. GBS and GC/CT done today. Baby very active on initial exam - FHR 180s-190 with lots of movement. Pt also states she took a tylenol with caffeine this morning and has not had much water to drink. NST - initial baseline 170s with + accels to 180s-190s, after PO hydration baseline 140s, moderate variability, reactive NST. Rev'd precautions.

## 2011-08-16 ENCOUNTER — Ambulatory Visit (INDEPENDENT_AMBULATORY_CARE_PROVIDER_SITE_OTHER): Payer: Commercial Managed Care - PPO | Admitting: Family

## 2011-08-16 DIAGNOSIS — Z3480 Encounter for supervision of other normal pregnancy, unspecified trimester: Secondary | ICD-10-CM

## 2011-08-16 NOTE — Progress Notes (Signed)
Parents coming at 41 wks pregnancy to assist with childcare, hopes to wait that long; no questions; continued itching, denies itching on hands and feet, only on arms; will pick up RX today and try Benadryl.  Reviewed GBS & GC/CT results

## 2011-08-16 NOTE — Progress Notes (Signed)
p-96.6

## 2011-08-23 ENCOUNTER — Ambulatory Visit (INDEPENDENT_AMBULATORY_CARE_PROVIDER_SITE_OTHER): Payer: Commercial Managed Care - PPO | Admitting: Physician Assistant

## 2011-08-23 ENCOUNTER — Encounter (HOSPITAL_COMMUNITY): Payer: Self-pay | Admitting: *Deleted

## 2011-08-23 ENCOUNTER — Inpatient Hospital Stay (HOSPITAL_COMMUNITY)
Admission: AD | Admit: 2011-08-23 | Discharge: 2011-08-23 | Disposition: A | Discharge status: Home / Self Care | Payer: Self-pay | Source: Ambulatory Visit | Attending: Family Medicine | Admitting: Family Medicine

## 2011-08-23 DIAGNOSIS — Z3480 Encounter for supervision of other normal pregnancy, unspecified trimester: Secondary | ICD-10-CM

## 2011-08-23 DIAGNOSIS — O479 False labor, unspecified: Secondary | ICD-10-CM

## 2011-08-23 NOTE — Patient Instructions (Signed)
Labor Induction A pregnant woman usually goes into labor spontaneously before the birth of her baby. Most babies are born between 37 and 42 weeks of the pregnancy. When this does not happen, caregivers may use medication or other methods to bring on (induce) labor. Labor induction causes a pregnant woman's uterus to contract, the cervix to open (dilate) and thin out (efface) to prepare for the vaginal birth of her baby. Several methods of labor induction may be used such as:  Massaging the nipple and areola of the breasts (nipple stimulation).   Prostaglandin medication used orally or as a vaginal cream.   Striping of membranes (your caregiver inserts a finger between the cervix and membranes around the baby's head) causes the body to produce prostaglandins that soften the cervix and cause the uterus to contract.   Rupture of the water bag (amniotomy).   Oxytocin by IV.   Special dilators placed into the cervical canal that causes the cervix to soften and open.   Mechanical devices to stretch open the cervix such as, a dilated foley catheter.  Whether your labor will be induced depends on the condition of you and your baby, how far along you are, are the baby's lung maturity, the condition of the cervix, the way the baby is lying, and other factors. Usually, labor is not induced before 39 weeks of the pregnancy unless there is a problem with the baby or mother, and it becomes necessary to induce labor. REASONS LABOR SHOULD BE INDUCED  The health of the baby or mother has become at risk.   The pregnancy is overdue by 2 weeks or more.   Your water breaks (premature rupture of membranes), the baby's lungs are mature, and labor does not start on its own.   You develop high blood pressure (toxemia of pregnancy).   You develop an infection in your uterus.   You have diabetes or other serious medical illness.   Amniotic fluid amounts are small around the baby.   Your placenta begins to  separate from the inner wall of the uterus before the baby is born (placental abruption). This condition may cause you to have an emergency Cesarean delivery.   You have fetal death.   A social induction is also known as an induction for convenience. Most of the time, labor is induced for sound medical reasons. Sometimes, it is done as a convenience. Living a long way from the hospital or having a history of very rapid labors may be reasons the mother may want to induce delivery.  REASONS LABOR SHOULD NOT BE INDUCED  You have had previous surgeries on your uterus. This is especially true if the surgeries went into the inside lining and cavity of the uterus. This gives an added risk for rupturing the uterus.   You have placenta previa. This means your placenta lies very low in the uterus and blocks the opening (cervix) for the baby to get out.   Your baby is not in a head down position. For example, if your baby lies across your uterus (transverse) instead of head first.   If the umbilical cord drops down into the birth canal in front of your baby. This could cut off the baby's blood supply and oxygen to the baby.  RISKS AND COMPLICATIONS Problems seldom occur with labor induction, but there can be some complications. Some of the risks of induction include:  Change in fetal heart rate (too high, too low or irradic).   Increased risk of   a premature baby, even if you think your baby is term.   Increased risk of fetal distress. This means your baby gets into problems during induction. This can be caused by the umbilical cord coming out in front of the baby or is being squeezed.   Increased risk of infection to mother and baby.   Increased chance of having a Cesarean delivery. This is an operation on your belly (abdomen) to remove the baby.   Strong contractions can lead to abruption. This is a separating of the placenta from the uterus.   Uterine rupture, especially if you had a previous  Cesarean or surgery on your uterus.  When labor is induced because of medical problems, other risks may be present. Induced labor may lead to:  Increased use of medications for pain relief.   Other interventions.  When induction is needed for medical reasons, the benefits of induction may outweigh the risks. PROCEDURE It can sometimes take up to 2 or 3 days to induce labor. It usually takes less time. It takes longer when you are induced early in the pregnancy and for first pregnancies.  Before coming to the hospital for an induction:  Do not eat much before you come to the hospital (for at least 8 hours).   Do not eat after midnight if you are going to be induced the next morning.   Be aware that medications for labor induction can upset your stomach.   Let your caregiver know if you need medications for pain.  HOME CARE INSTRUCTIONS If you have been induced in your caregiver's office to start labor, and are allowed to go home, follow the instructions given to you by your care giver. SEEK IMMEDIATE MEDICAL CARE IF:  You develop any kind of vaginal bleeding.   You develop contractions that are severe and continuous.   You feel faint or feel light headed.   You do not develop contractions within the time your caregiver suggests you should.   You begin to run a temperature of 100 F (37.8 C) or develop chills.   You no longer feel the normal fetal movement.  Document Released: 11/10/2006 Document Revised: 03/03/2011 Document Reviewed: 02/28/2009 [employer] Patient Information 2012 [employer]. 

## 2011-08-23 NOTE — Progress Notes (Signed)
Contractions q 3-5 at home

## 2011-08-23 NOTE — Discharge Instructions (Signed)
Normal Labor and Delivery °Your caregiver must first be sure you are in labor. Signs of labor include: °· You may pass what is called "the mucus plug" before labor begins. This is a small amount of blood stained mucus.  °· Regular uterine contractions.  °· The time between contractions get closer together.  °· The discomfort and pain gradually gets more intense.  °· Pains are mostly located in the back.  °· Pains get worse when walking.  °· The cervix (the opening of the uterus becomes thinner (begins to efface) and opens up (dilates).  °Once you are in labor and admitted into the hospital or care center, your caregiver will do the following: °· A complete physical examination.  °· Check your vital signs (blood pressure, pulse, temperature and the fetal heart rate).  °· Do a vaginal examination (using a sterile glove and lubricant) to determine:  °· The position (presentation) of the baby (head [vertex] or buttock first).  °· The level (station) of the baby's head in the birth canal.  °· The effacement and dilatation of the cervix.  °· You may have your pubic hair shaved and be given an enema depending on your caregiver and the circumstance.  °· An electronic monitor is usually placed on your abdomen. The monitor follows the length and intensity of the contractions, as well as the baby's heart rate.  °· Usually, your caregiver will insert an IV in your arm with a bottle of sugar water. This is done as a precaution so that medications can be given to you quickly during labor or delivery.  °NORMAL LABOR AND DELIVERY IS DIVIDED UP INTO 3 STAGES: °First Stage °This is when regular contractions begin and the cervix begins to efface and dilate. This stage can last from 3 to 15 hours. The end of the first stage is when the cervix is 100% effaced and 10 centimeters dilated. Pain medications may be given by  °· Injection (morphine, demerol, etc.)  °· Regional anesthesia (spinal, caudal or epidural, anesthetics given in  different locations of the spine). Paracervical pain medication may be given, which is an injection of and anesthetic on each side of the cervix.  °A pregnant woman may request to have "Natural Childbirth" which is not to have any medications or anesthesia during her labor and delivery. °Second Stage °This is when the baby comes down through the birth canal (vagina) and is born. This can take 1 to 4 hours. As the baby's head comes down through the birth canal, you may feel like you are going to have a bowel movement. You will get the urge to bear down and push until the baby is delivered. As the baby's head is being delivered, the caregiver will decide if an episiotomy (a cut in the perineum and vagina area) is needed to prevent tearing of the tissue in this area. The episiotomy is sewn up after the delivery of the baby and placenta. Sometimes a mask with nitrous oxide is given for the mother to breath during the delivery of the baby to help if there is too much pain. The end of Stage 2 is when the baby is fully delivered. Then when the umbilical cord stops pulsating it is clamped and cut. °Third Stage °The third stage begins after the baby is completely delivered and ends after the placenta (afterbirth) is delivered. This usually takes 5 to 30 minutes. After the placenta is delivered, a medication is given either by intravenous or injection to help contract   the uterus and prevent bleeding. The third stage is not painful and pain medication is usually not necessary. If an episiotomy was done, it is repaired at this time. °After the delivery, the mother is watched and monitored closely for 1 to 2 hours to make sure there is no postpartum bleeding (hemorrhage). If there is a lot of bleeding, medication is given to contract the uterus and stop the bleeding. °Document Released: 03/30/2008 Document Revised: 03/03/2011 Document Reviewed: 03/30/2008 °[employer]® Patient Information ©2012 [employer]. °

## 2011-08-23 NOTE — Progress Notes (Signed)
p-84 

## 2011-08-23 NOTE — Progress Notes (Signed)
No complaints. Varicose veins same. Reviewed IOL at 41 weeks.

## 2011-08-23 NOTE — ED Provider Notes (Signed)
History     Chief Complaint  Patient presents with  . Labor Eval   HPI This is a 36 y.o. at [redacted]w[redacted]d who presents with contractions for labor eval. States UCs were stonger at home. Denies leaking or bleeding and reports + FM.  States it will be difficult to get childcare in middle of night if labor progresses. Worries about what to do with kids if labors.  OB History    Grav Para Term Preterm Abortions TAB SAB Ect Mult Living   6 3 3  2  2   3       Past Medical History  Diagnosis Date  . Chronic headaches     Increases in pregnancy; tx with Fioricet (relief)  . Varicose veins     Right Leg  . Urinary incontinence     After 3rd baby    Past Surgical History  Procedure Date  . Wisdom tooth extraction 1992    Family History  Problem Relation Age of Onset  . Diabetes Paternal Grandmother   . Cancer Maternal Grandfather     prostate and Lung    History  Substance Use Topics  . Smoking status: Never Smoker   . Smokeless tobacco: Never Used  . Alcohol Use: No    Allergies:  Allergies  Allergen Reactions  . Erythromycin Hives    No prescriptions prior to admission    ROS As above.  Physical Exam   Blood pressure 111/72, pulse 97, temperature 97.3 F (36.3 C), resp. rate 18, height 5\' 11"  (1.803 m), weight 224 lb (101.606 kg), last menstrual period 11/26/2010.  Physical Exam  Constitutional: She is oriented to person, place, and time. She appears well-developed and well-nourished.  HENT:  Head: Normocephalic.  Cardiovascular: Normal rate.   Respiratory: Effort normal.  GI: Soft. She exhibits no distension and no mass. There is no tenderness. There is no rebound and no guarding.  Genitourinary: Vagina normal and uterus normal. No vaginal discharge found.       FHR reactive with mild uterine contractions every 2 minutes which last about 30 seconds. Pt able to talk through them, does not look uncomfortable. Cervix   Loose 1/long/-3/vtx/post  Musculoskeletal:  Normal range of motion.  Neurological: She is alert and oriented to person, place, and time.  Skin: Skin is warm and dry.  Psychiatric: She has a normal mood and affect.    MAU Course  Procedures  Assessment and Plan  A:  [facility] at [redacted]w[redacted]d      Prodromal contractions without cervical change (same cx in office) P:  Wants to be kept for induction      Explained there is no clinical indication for induction/augmentation  And that it would be ok to bring kids with her if necessary at night.     Offered recheck in one hour, pt wishes to go home      Labor precautions.   [facility] 08/23/2011, 11:52 PM

## 2011-08-24 DIAGNOSIS — O479 False labor, unspecified: Secondary | ICD-10-CM

## 2011-08-24 NOTE — ED Provider Notes (Signed)
Chart reviewed and agree with management and plan.  

## 2011-08-30 MED ORDER — TRIAMCINOLONE ACETONIDE 0.1 % EX OINT: TOPICAL_OINTMENT | Freq: Two times a day (BID) | CUTANEOUS | Status: DC

## 2011-08-31 ENCOUNTER — Ambulatory Visit (INDEPENDENT_AMBULATORY_CARE_PROVIDER_SITE_OTHER): Payer: Commercial Managed Care - PPO | Admitting: Obstetrics & Gynecology

## 2011-08-31 DIAGNOSIS — Z3480 Encounter for supervision of other normal pregnancy, unspecified trimester: Secondary | ICD-10-CM

## 2011-08-31 NOTE — Progress Notes (Signed)
p-96  Induction scheduled for 3/6 @ 7:30 pm per Dr Penne Lash.  Plan is Pitocin

## 2011-08-31 NOTE — Progress Notes (Signed)
Induction to be scheduled on March 6th--pts parents arrive March 5th. Pt started triamcenalone cream for rash on upper extremities.  No other probs.

## 2011-08-31 NOTE — Patient Instructions (Signed)
Vasectomy A vasectomy is tying (with or without cutting) the tube that collects the sperm from the testicle (vas deferens). The vasectomy blocks the sperm from going through the vas deferens and penis so that during sexual intercourse, the sperm does not go into the vagina. Vasectomy is safe, with very rare complications. It does not affect your sexual desire or performance.  Since vasectomy is considered permanent, you should not have it done until you are sure you do not want any more children. You and your partner should be in full agreement to have the procedure. Your decision to have a vasectomy should not be made during a stressful situation. This includes loss of a pregnancy, illness, death of a spouse or divorce. There are other means of contraception that can be used until you are completely sure you want this procedure done. A vasectomy does not protect you from sexually transmitted disease. Men who want the vasectomy reversed need an operation. It may not be successful. A vasectomy has less risk and is less expensive than a woman having a tubal sterilization. LET YOUR CAREGIVER KNOW ABOUT:   Allergies.   Medications taken including herbs, eye drops, over the counter medications, and creams.   Use of steroids (by mouth or creams).   Past problems with anesthetics or Novocaine.   History of blood clots (thrombophlebitis).   History of bleeding or blood problems.   Past surgery.  RISKS AND COMPLICATIONS  Failure of the procedure to cause infertility. This means you would still be able to get a female pregnant.   Post-operative pain. This can usually be controlled with medicine.   Infection. A germ starts growing in the wound. This can usually be treated with antibiotics.   An allergic reaction to the anesthetic or other medicine given.   Bleeding. Blood may seep under the skin so that the scrotum and penis appear to be bruised. Sometimes the scrotum can swell and get the size of  a grapefruit. This usually disappears without treatment within a week or two.  PROCEDURE  The scrotum is cleaned with bacterial killing soap and the caregiver finds the vas deferens.   Each side of the scrotum is numbed.   A very small cut (incision) is made, and the vas deferens are pulled out of the scrotum. The vas deferens are then tied off, cut or may be burned (cauterized) at the ends.   Sometimes the vas deferens are pulled out from the scrotum through a puncture wound. This is done with a special instrument without an incision.   The vas deferens are then put back into the scrotum, and the incision or puncture wound is closed.   After surgery, sperm may still be left in the vas deferens for 1 to 3 months. Because of this, other means of contraception should be used until your caregiver examines you and finds there are no sperm in your seminal fluid.   Castration is another method that is the surgical removal of both testicles. It causes sterilization in men.  Having a vasectomy should be discussed with your care giver with you and your partner present. Ask questions and talk about your concerns with your caregiver. Then, you can decide if the operation is safe for you. You can change your mind and cancel the surgery at any time. HOME CARE INSTRUCTIONS   Only take over-the-counter or prescription medicines for pain, discomfort or fever as directed by your caregiver.   An ice pack for 15 to 20 minutes,  3 to 4 times per day will help decrease swelling.   Wearing supportive briefs or an athletic supporter will help decrease swelling.   Avoid being active for the first two days after surgery.   Keep the incisions clean and covered to prevent infection.   Some oozing of blood from the cut (incision) is normal during the first day or two after surgery.   Do not participate in sports or do heavy physical labor for at least two weeks.   Follow your caregivers instructions regarding  diet, rest, work, social and sexual activities and follow up appointments.  SEEK IMMEDIATE MEDICAL CARE IF:   There is redness, swelling, or increasing pain in the wounds or testicles.   Pus is coming from wound.   An unexplained oral temperature above 102 F (38.9 C) develops, or as directed by your caregiver.   You notice a bad smell coming from the wound or material covering the wound (dressing).   There is a breaking open of the stitches (suture) or wound edges even after sutures have been removed. Sometimes these incisions are not sutured.   There is increased bleeding from the wounds.  MAKE SURE YOU:   Understand these instructions.   Will watch your condition.   Will get help right away if you are not doing well or get worse.  Document Released: 09/11/2002 Document Revised: 03/03/2011 Document Reviewed: 11/07/2007 [facility] Patient Information 2012 [place], [place].Breastfeeding BENEFITS OF BREASTFEEDING For the baby  The first milk (colostrum) helps the baby's digestive system function better.   There are antibodies from the mother in the milk that help the baby fight off infections.   The baby has a lower incidence of asthma, allergies, and SIDS (sudden infant death syndrome).   The nutrients in breast milk are better than formulas for the baby and helps the baby's brain grow better.   Babies who breastfeed have less gas, colic, and constipation.  For the mother  Breastfeeding helps develop a very special bond between mother and baby.   It is more convenient, always available at the correct temperature and cheaper than formula feeding.   It burns calories in the mother and helps with losing weight that was gained during pregnancy.   It makes the uterus contract back down to normal size faster and slows bleeding following delivery.   Breastfeeding mothers have a lower risk of developing breast cancer.  NURSE FREQUENTLY  A healthy, full-term baby may breastfeed  as often as every hour or space his or her feedings to every 3 hours.   How often to nurse will vary from baby to baby. Watch your baby for signs of hunger, not the clock.   Nurse as often as the baby requests, or when you feel the need to reduce the fullness of your breasts.   Awaken the baby if it has been 3 to 4 hours since the last feeding.   Frequent feeding will help the mother make more milk and will prevent problems like sore nipples and engorgement of the breasts.  BABY'S POSITION AT THE BREAST  Whether lying down or sitting, be sure that the baby's tummy is facing your tummy.   Support the breast with 4 fingers underneath the breast and the thumb above. Make sure your fingers are well away from the nipple and baby's mouth.   Stroke the baby's lips and cheek closest to the breast gently with your finger or nipple.   When the baby's mouth is open wide enough,  place all of your nipple and as much of the dark area around the nipple as possible into your baby's mouth.   Pull the baby in close so the tip of the nose and the baby's cheeks touch the breast during the feeding.  FEEDINGS  The length of each feeding varies from baby to baby and from feeding to feeding.   The baby must suck about 2 to 3 minutes for your milk to get to him or her. This is called a "let down." For this reason, allow the baby to feed on each breast as long as he or she wants. Your baby will end the feeding when he or she has received the right balance of nutrients.   To break the suction, put your finger into the corner of the baby's mouth and slide it between his or her gums before removing your breast from his or her mouth. This will help prevent sore nipples.  REDUCING BREAST ENGORGEMENT  In the first week after your baby is born, you may experience signs of breast engorgement. When breasts are engorged, they feel heavy, warm, full, and may be tender to the touch. You can reduce engorgement if you:    Nurse frequently, every 2 to 3 hours. Mothers who breastfeed early and often have fewer problems with engorgement.   Place light ice packs on your breasts between feedings. This reduces swelling. Wrap the ice packs in a lightweight towel to protect your skin.   Apply moist hot packs to your breast for 5 to 10 minutes before each feeding. This increases circulation and helps the milk flow.   Gently massage your breast before and during the feeding.   Make sure that the baby empties at least one breast at every feeding before switching sides.   Use a breast pump to empty the breasts if your baby is sleepy or not nursing well. You may also want to pump if you are returning to work or or you feel you are getting engorged.   Avoid bottle feeds, pacifiers or supplemental feedings of water or juice in place of breastfeeding.   Be sure the baby is latched on and positioned properly while breastfeeding.   Prevent fatigue, stress, and anemia.   Wear a supportive bra, avoiding underwire styles.   Eat a balanced diet with enough fluids.  If you follow these suggestions, your engorgement should improve in 24 to 48 hours. If you are still experiencing difficulty, call your lactation consultant or caregiver. IS MY BABY GETTING ENOUGH MILK? Sometimes, mothers worry about whether their babies are getting enough milk. You can be assured that your baby is getting enough milk if:  The baby is actively sucking and you hear swallowing.   The baby nurses at least 8 to 12 times in a 24 hour time period. Nurse your baby until he or she unlatches or falls asleep at the first breast (at least 10 to 20 minutes), then offer the second side.   The baby is wetting 5 to 6 disposable diapers (6 to 8 cloth diapers) in a 24 hour period by 71 to 52 days of age.   The baby is having at least 2 to 3 stools every 24 hours for the first few months. Breast milk is all the food your baby needs. It is not necessary for your  baby to have water or formula. In fact, to help your breasts make more milk, it is best not to give your baby supplemental feedings during  the early weeks.   The stool should be soft and yellow.   The baby should gain 4 to 7 ounces per week after he is 59 days old.  TAKE CARE OF YOURSELF Take care of your breasts by:  Bathing or showering daily.   Avoiding the use of soaps on your nipples.   Start feedings on your left breast at one feeding and on your right breast at the next feeding.   You will notice an increase in your milk supply 2 to 5 days after delivery. You may feel some discomfort from engorgement, which makes your breasts very firm and often tender. Engorgement "peaks" out within 24 to 48 hours. In the meantime, apply warm moist towels to your breasts for 5 to 10 minutes before feeding. Gentle massage and expression of some milk before feeding will soften your breasts, making it easier for your baby to latch on. Wear a well fitting nursing bra and air dry your nipples for 10 to 15 minutes after each feeding.   Only use cotton bra pads.   Only use pure lanolin on your nipples after nursing. You do not need to wash it off before nursing.  Take care of yourself by:   Eating well-balanced meals and nutritious snacks.   Drinking milk, fruit juice, and water to satisfy your thirst (about 8 glasses a day).   Getting plenty of rest.   Increasing calcium in your diet (1200 mg a day).   Avoiding foods that you notice affect the baby in a bad way.  SEEK MEDICAL CARE IF:   You have any questions or difficulty with breastfeeding.   You need help.   You have a hard, red, sore area on your breast, accompanied by a fever of 100.5 F (38.1 C) or more.   Your baby is too sleepy to eat well or is having trouble sleeping.   Your baby is wetting less than 6 diapers per day, by 57 days of age.   Your baby's skin or white part of his or her eyes is more yellow than it was in the  hospital.   You feel depressed.  Document Released: 06/21/2005 Document Revised: 03/03/2011 Document Reviewed: 02/03/2009 [facility] Patient Information 2012 [place], [place].

## 2011-09-01 ENCOUNTER — Encounter (HOSPITAL_COMMUNITY): Payer: Self-pay | Admitting: *Deleted

## 2011-09-01 ENCOUNTER — Telehealth (HOSPITAL_COMMUNITY): Payer: Self-pay | Admitting: *Deleted

## 2011-09-01 NOTE — Telephone Encounter (Signed)
Preadmission screen  

## 2011-09-08 ENCOUNTER — Inpatient Hospital Stay (HOSPITAL_COMMUNITY)
Admission: RE | Admit: 2011-09-08 | Discharge: 2011-09-10 | DRG: 774 | Disposition: A | Discharge status: Home / Self Care | Payer: Commercial Managed Care - PPO | Source: Ambulatory Visit | Attending: Obstetrics & Gynecology | Admitting: Obstetrics & Gynecology

## 2011-09-08 ENCOUNTER — Inpatient Hospital Stay (HOSPITAL_COMMUNITY): Payer: Commercial Managed Care - PPO | Admitting: Anesthesiology

## 2011-09-08 ENCOUNTER — Encounter (HOSPITAL_COMMUNITY): Payer: Self-pay | Admitting: Anesthesiology

## 2011-09-08 ENCOUNTER — Encounter (HOSPITAL_COMMUNITY): Payer: Self-pay

## 2011-09-08 DIAGNOSIS — O09529 Supervision of elderly multigravida, unspecified trimester: Secondary | ICD-10-CM | POA: Diagnosis present

## 2011-09-08 DIAGNOSIS — O480 Post-term pregnancy: Principal | ICD-10-CM | POA: Diagnosis present

## 2011-09-08 LAB — CBC
Hemoglobin: 13.6 g/dL (ref 12.0–15.0)
MCH: 31.5 pg (ref 26.0–34.0)
MCHC: 35.4 g/dL (ref 30.0–36.0)
Platelets: 203 10*3/uL (ref 150–400)
RBC: 4.32 MIL/uL (ref 3.87–5.11)

## 2011-09-08 MED ORDER — FENTANYL 2.5 MCG/ML BUPIVACAINE 1/10 % EPIDURAL INFUSION (WH - ANES)
14.0000 mL/h | INTRAMUSCULAR | Status: DC
  Administered 2011-09-09 (×2): 14 mL/h via EPIDURAL
  Filled 2011-09-08 (×3): qty 60

## 2011-09-08 MED ORDER — IBUPROFEN 600 MG PO TABS
600.0000 mg | ORAL_TABLET | Freq: Four times a day (QID) | ORAL | Status: DC | PRN
  Administered 2011-09-09: 600 mg via ORAL
  Filled 2011-09-08: qty 1

## 2011-09-08 MED ORDER — EPHEDRINE 5 MG/ML INJ: 10.0000 mg | INTRAVENOUS | Status: DC | PRN

## 2011-09-08 MED ORDER — ACETAMINOPHEN 325 MG PO TABS
650.0000 mg | ORAL_TABLET | ORAL | Status: DC | PRN
  Administered 2011-09-09: 650 mg via ORAL
  Filled 2011-09-08: qty 2

## 2011-09-08 MED ORDER — NALBUPHINE SYRINGE 5 MG/0.5 ML: 5.0000 mg | INJECTION | INTRAMUSCULAR | Status: DC | PRN

## 2011-09-08 MED ORDER — OXYTOCIN 20 UNITS IN LACTATED RINGERS INFUSION - SIMPLE
125.0000 mL/h | Freq: Once | INTRAVENOUS | Status: AC
  Administered 2011-09-09: 125 mL/h via INTRAVENOUS

## 2011-09-08 MED ORDER — FLEET ENEMA 7-19 GM/118ML RE ENEM: 1.0000 | ENEMA | RECTAL | Status: DC | PRN

## 2011-09-08 MED ORDER — LIDOCAINE HCL (PF) 1 % IJ SOLN
30.0000 mL | INTRAMUSCULAR | Status: DC | PRN
  Filled 2011-09-08: qty 30

## 2011-09-08 MED ORDER — ONDANSETRON HCL 4 MG/2ML IJ SOLN: 4.0000 mg | Freq: Four times a day (QID) | INTRAMUSCULAR | Status: DC | PRN

## 2011-09-08 MED ORDER — PHENYLEPHRINE 40 MCG/ML (10ML) SYRINGE FOR IV PUSH (FOR BLOOD PRESSURE SUPPORT): 80.0000 ug | PREFILLED_SYRINGE | INTRAVENOUS | Status: DC | PRN

## 2011-09-08 MED ORDER — OXYCODONE-ACETAMINOPHEN 5-325 MG PO TABS: 1.0000 | ORAL_TABLET | ORAL | Status: DC | PRN

## 2011-09-08 MED ORDER — LACTATED RINGERS IV SOLN: 500.0000 mL | Freq: Once | INTRAVENOUS | Status: DC

## 2011-09-08 MED ORDER — PHENYLEPHRINE 40 MCG/ML (10ML) SYRINGE FOR IV PUSH (FOR BLOOD PRESSURE SUPPORT)
80.0000 ug | PREFILLED_SYRINGE | INTRAVENOUS | Status: DC | PRN
  Filled 2011-09-08: qty 5

## 2011-09-08 MED ORDER — OXYTOCIN BOLUS FROM INFUSION
500.0000 mL | Freq: Once | INTRAVENOUS | Status: DC
  Filled 2011-09-08: qty 500

## 2011-09-08 MED ORDER — TERBUTALINE SULFATE 1 MG/ML IJ SOLN: 0.2500 mg | Freq: Once | INTRAMUSCULAR | Status: AC | PRN

## 2011-09-08 MED ORDER — DIPHENHYDRAMINE HCL 50 MG/ML IJ SOLN: 12.5000 mg | INTRAMUSCULAR | Status: DC | PRN

## 2011-09-08 MED ORDER — LACTATED RINGERS IV SOLN
INTRAVENOUS | Status: DC
  Administered 2011-09-08 – 2011-09-09 (×3): via INTRAVENOUS

## 2011-09-08 MED ORDER — LACTATED RINGERS IV SOLN: 500.0000 mL | INTRAVENOUS | Status: DC | PRN

## 2011-09-08 MED ORDER — OXYTOCIN 20 UNITS IN LACTATED RINGERS INFUSION - SIMPLE
1.0000 m[IU]/min | INTRAVENOUS | Status: DC
  Administered 2011-09-08: 2 m[IU]/min via INTRAVENOUS
  Filled 2011-09-08: qty 1000

## 2011-09-08 MED ORDER — FENTANYL 2.5 MCG/ML BUPIVACAINE 1/10 % EPIDURAL INFUSION (WH - ANES)
INTRAMUSCULAR | Status: DC | PRN
  Administered 2011-09-08: 16 mL/h via EPIDURAL

## 2011-09-08 MED ORDER — LIDOCAINE HCL (PF) 1 % IJ SOLN
INTRAMUSCULAR | Status: DC | PRN
  Administered 2011-09-08 (×2): 5 mL

## 2011-09-08 MED ORDER — CITRIC ACID-SODIUM CITRATE 334-500 MG/5ML PO SOLN: 30.0000 mL | ORAL | Status: DC | PRN

## 2011-09-08 MED ORDER — EPHEDRINE 5 MG/ML INJ
10.0000 mg | INTRAVENOUS | Status: DC | PRN
  Filled 2011-09-08: qty 4

## 2011-09-08 NOTE — Anesthesia Preprocedure Evaluation (Signed)
Anesthesia Evaluation  Patient identified by MRN, date of birth, ID band Patient awake    Reviewed: Allergy & Precautions, H&P , Patient's Chart, lab work & pertinent test results  Airway Mallampati: III TM Distance: >3 FB Neck ROM: full    Dental No notable dental hx. (+) Teeth Intact   Pulmonary neg pulmonary ROS,  breath sounds clear to auscultation  Pulmonary exam normal       Cardiovascular negative cardio ROS  Rhythm:regular Rate:Normal     Neuro/Psych  Headaches, negative psych ROS   GI/Hepatic negative GI ROS, Neg liver ROS,   Endo/Other  negative endocrine ROS  Renal/GU negative Renal ROS  negative genitourinary   Musculoskeletal   Abdominal Normal abdominal exam  (+)   Peds  Hematology negative hematology ROS (+)   Anesthesia Other Findings   Reproductive/Obstetrics (+) Pregnancy                           Anesthesia Physical Anesthesia Plan  ASA: II  Anesthesia Plan: Epidural   Post-op Pain Management:    Induction:   Airway Management Planned:   Additional Equipment:   Intra-op Plan:   Post-operative Plan:   Informed Consent: I have reviewed the patients History and Physical, chart, labs and discussed the procedure including the risks, benefits and alternatives for the proposed anesthesia with the patient or authorized representative who has indicated his/her understanding and acceptance.     Plan Discussed with: Anesthesiologist and Surgeon  Anesthesia Plan Comments:         Anesthesia Quick Evaluation

## 2011-09-08 NOTE — H&P (Signed)
Katie Hurley is a 36 y.o. female presenting for Induction of Labor for Postdatism History OB History    Grav Para Term Preterm Abortions TAB SAB Ect Mult Living   6 3 3  2  2   3      Past Medical History  Diagnosis Date  . Chronic headaches     Increases in pregnancy; tx with Fioricet (relief)  . Varicose veins     Right Leg  . Urinary incontinence     After 3rd baby   Past Surgical History  Procedure Date  . Wisdom tooth extraction 1992   Family History: family history includes Cancer in her maternal grandfather and Diabetes in her paternal grandmother. Social History:  reports that she has never smoked. She has never used smokeless tobacco. She reports that she does not drink alcohol or use illicit drugs.  Review of Systems  Constitutional: Negative for fever.  Neurological: Negative for headaches.    Dilation: 3.5 Effacement (%): 40 Station: -3 Exam by:: Wynelle Bourgeois CNM Temperature 98.2 F (36.8 C), temperature source Oral, height 5\' 11"  (1.803 m), weight 220 lb (99.791 kg), last menstrual period 11/26/2010, unknown if currently breastfeeding. Maternal Exam:  Uterine Assessment: Contraction strength is moderate.  Contraction frequency is irregular.   Abdomen: Fundal height is 40.   Estimated fetal weight is 7.5.   Fetal presentation: vertex  Introitus: Normal vulva. Normal vagina.  Vagina is negative for discharge.  Ferning test: not done.  Nitrazine test: not done. Amniotic fluid character: not assessed.  Pelvis: adequate for delivery.   Cervix: Cervix evaluated by digital exam.     Fetal Exam Fetal Monitor Review: Mode: ultrasound.   Baseline rate: 160-170.  Variability: moderate (6-25 bpm).   Pattern: accelerations present and no decelerations.    Fetal State Assessment: Category II - tracings are indeterminate.     Physical Exam  Constitutional: She is oriented to person, place, and time. She appears well-developed and well-nourished. No  distress.  HENT:  Head: Normocephalic.  Cardiovascular: Normal rate.   Respiratory: Effort normal.  GI: Soft. She exhibits no distension and no mass. There is no tenderness. There is no rebound and no guarding.  Genitourinary: Vagina normal and uterus normal. No vaginal discharge found.  Musculoskeletal: Normal range of motion.  Neurological: She is alert and oriented to person, place, and time.  Skin: Skin is warm and dry.  Psychiatric: She has a normal mood and affect.   Cervix 1-2cm internal os/3-4cm external os/40%/soft/-3 Prenatal labs: ABO, Rh: O/POS/-- (08/23 0953) Antibody: NEG (08/23 0953) Rubella: 205.1 (08/23 0953) RPR: NON REAC (12/14 0824)  HBsAg: NEGATIVE (08/23 0953)  HIV: NON REACTIVE (12/14 0824)  GBS:     Assessment/Plan: A:  SIUP at [redacted]w[redacted]d       Bishop Score = 4      Transient fetal tachycardia with good variability      PostDates P:  Induction of labor       [facility] 09/08/2011, 9:02 PM

## 2011-09-08 NOTE — Anesthesia Procedure Notes (Signed)
Epidural Patient location during procedure: OB Start time: 09/08/2011 11:20 PM  Staffing Anesthesiologist: Shoichi Mielke A. Performed by: anesthesiologist   Preanesthetic Checklist Completed: patient identified, site marked, surgical consent, pre-op evaluation, timeout performed, IV checked, risks and benefits discussed and monitors and equipment checked  Epidural Patient position: sitting Prep: site prepped and draped and DuraPrep Patient monitoring: continuous pulse ox and blood pressure Approach: midline Injection technique: LOR air  Needle:  Needle type: Tuohy  Needle gauge: 17 G Needle length: 9 cm Needle insertion depth: 6 cm Catheter type: closed end flexible Catheter size: 19 Gauge Catheter at skin depth: 11 cm Test dose: negative and Other  Assessment Events: blood not aspirated, injection not painful, no injection resistance, negative IV test and no paresthesia  Additional Notes Patient identified. Risks and benefits discussed including failed block, incomplete  Pain control, post dural puncture headache, nerve damage, paralysis, blood pressure Changes, nausea, vomiting, reactions to medications-both toxic and allergic and post Partum back pain. All questions were answered. Patient expressed understanding and wished to proceed. Sterile technique was used throughout procedure. Epidural site was Dressed with sterile barrier dressing. No paresthesias, signs of intravascular injection Or signs of intrathecal spread were encountered.  Patient was more comfortable after the epidural was dosed. Please see RN's note for documentation of vital signs and FHR which are stable.

## 2011-09-08 NOTE — Progress Notes (Signed)
Patient ID: Katie Hurley, female   DOB: 02/27/1976, 36 y.o.   MRN: [id number]  Doing well but thinks she wants epidural soon.  FHR reactive, rate has settled down to 140s-150s. With average variability  UCs every 2-4 minutes  Cervix deferred  Will continue to observe

## 2011-09-09 ENCOUNTER — Encounter (HOSPITAL_COMMUNITY): Payer: Self-pay

## 2011-09-09 DIAGNOSIS — O480 Post-term pregnancy: Secondary | ICD-10-CM

## 2011-09-09 DIAGNOSIS — O09529 Supervision of elderly multigravida, unspecified trimester: Secondary | ICD-10-CM

## 2011-09-09 LAB — RPR: RPR Ser Ql: NONREACTIVE

## 2011-09-09 MED ORDER — WITCH HAZEL-GLYCERIN EX PADS: 1.0000 "application " | MEDICATED_PAD | CUTANEOUS | Status: DC | PRN

## 2011-09-09 MED ORDER — BENZOCAINE-MENTHOL 20-0.5 % EX AERO: 1.0000 "application " | INHALATION_SPRAY | CUTANEOUS | Status: DC | PRN

## 2011-09-09 MED ORDER — OXYTOCIN 10 UNIT/ML IJ SOLN
INTRAMUSCULAR | Status: AC
  Administered 2011-09-09: 20 [IU]
  Filled 2011-09-09: qty 2

## 2011-09-09 MED ORDER — ONDANSETRON HCL 4 MG PO TABS: 4.0000 mg | ORAL_TABLET | ORAL | Status: DC | PRN

## 2011-09-09 MED ORDER — LANOLIN HYDROUS EX OINT: TOPICAL_OINTMENT | CUTANEOUS | Status: DC | PRN

## 2011-09-09 MED ORDER — SIMETHICONE 80 MG PO CHEW: 80.0000 mg | CHEWABLE_TABLET | ORAL | Status: DC | PRN

## 2011-09-09 MED ORDER — SENNOSIDES-DOCUSATE SODIUM 8.6-50 MG PO TABS
2.0000 | ORAL_TABLET | Freq: Every day | ORAL | Status: DC
  Administered 2011-09-09: 2 via ORAL

## 2011-09-09 MED ORDER — IBUPROFEN 600 MG PO TABS
600.0000 mg | ORAL_TABLET | Freq: Four times a day (QID) | ORAL | Status: DC
  Administered 2011-09-09 – 2011-09-10 (×5): 600 mg via ORAL
  Filled 2011-09-09 (×5): qty 1

## 2011-09-09 MED ORDER — DIBUCAINE 1 % RE OINT
1.0000 "application " | TOPICAL_OINTMENT | RECTAL | Status: DC | PRN
  Filled 2011-09-09: qty 28

## 2011-09-09 MED ORDER — ONDANSETRON HCL 4 MG/2ML IJ SOLN: 4.0000 mg | INTRAMUSCULAR | Status: DC | PRN

## 2011-09-09 MED ORDER — METHYLERGONOVINE MALEATE 0.2 MG PO TABS
0.2000 mg | ORAL_TABLET | ORAL | Status: DC | PRN
Start: 2011-09-09 — End: 2011-09-11

## 2011-09-09 MED ORDER — ZOLPIDEM TARTRATE 5 MG PO TABS: 5.0000 mg | ORAL_TABLET | Freq: Every evening | ORAL | Status: DC | PRN

## 2011-09-09 MED ORDER — OXYTOCIN 10 UNIT/ML IJ SOLN
40.0000 [IU] | Freq: Once | INTRAVENOUS | Status: AC
  Administered 2011-09-09: 40 [IU] via INTRAVENOUS
  Filled 2011-09-09: qty 4

## 2011-09-09 MED ORDER — MISOPROSTOL 200 MCG PO TABS
ORAL_TABLET | ORAL | Status: AC
  Administered 2011-09-09: 200 ug
  Filled 2011-09-09: qty 3

## 2011-09-09 MED ORDER — TETANUS-DIPHTH-ACELL PERTUSSIS 5-2.5-18.5 LF-MCG/0.5 IM SUSP
0.5000 mL | Freq: Once | INTRAMUSCULAR | Status: AC
  Administered 2011-09-10: 0.5 mL via INTRAMUSCULAR
  Filled 2011-09-09: qty 0.5

## 2011-09-09 MED ORDER — MISOPROSTOL 200 MCG PO TABS: 600.0000 ug | ORAL_TABLET | Freq: Once | ORAL | Status: DC

## 2011-09-09 MED ORDER — DIPHENHYDRAMINE HCL 25 MG PO CAPS: 25.0000 mg | ORAL_CAPSULE | Freq: Four times a day (QID) | ORAL | Status: DC | PRN

## 2011-09-09 MED ORDER — OXYCODONE-ACETAMINOPHEN 5-325 MG PO TABS
1.0000 | ORAL_TABLET | ORAL | Status: DC | PRN
  Administered 2011-09-09 – 2011-09-10 (×3): 1 via ORAL
  Filled 2011-09-09 (×3): qty 1

## 2011-09-09 MED ORDER — PRENATAL MULTIVITAMIN CH
1.0000 | ORAL_TABLET | Freq: Every day | ORAL | Status: DC
  Administered 2011-09-09 – 2011-09-10 (×2): 1 via ORAL
  Filled 2011-09-09 (×2): qty 1

## 2011-09-09 MED ORDER — METHYLERGONOVINE MALEATE 0.2 MG/ML IJ SOLN
0.2000 mg | INTRAMUSCULAR | Status: DC | PRN
Start: 2011-09-09 — End: 2011-09-11

## 2011-09-09 NOTE — Progress Notes (Signed)
Patient ID: Katie Hurley, female   DOB: 05-Jan-1976, 36 y.o.   MRN: [id number]  Comfortable, sleeping at intervals.  FHR reassuring UCs every 3-4 minutes  Cervix:  Dilation: 4.5 Effacement (%): 70 Cervical Position: Middle Station: -2 Presentation: Vertex Exam by:: MWilliams  Membranes leaking clear fluid  WIll anticipate progress soon

## 2011-09-09 NOTE — Anesthesia Postprocedure Evaluation (Signed)
  Anesthesia Post Note  Patient: Katie Hurley  Procedure(s) Performed: * No procedures listed *  Anesthesia type: Epidural  Patient location: Mother/Baby  Post pain: Pain level controlled  Post assessment: Post-op Vital signs reviewed  Last Vitals:  Filed Vitals:   09/09/11 1400  BP: 115/75  Pulse: 70  Temp: 36.9 C  Resp: 18    Post vital signs: Reviewed  Level of consciousness:alert  Complications: No apparent anesthesia complications

## 2011-09-09 NOTE — Progress Notes (Signed)
Patient ID: Katie Hurley, female   DOB: 1975-08-19, 36 y.o.   MRN: [id number]   Doing well.   Comfortable now.   FHR stable. UCs every 2-3 minutes  Cervix:  Dilation: 3.5 Effacement (%): 30 Cervical Position: Posterior Station: -2 Presentation: Vertex Exam by:: JDaley  Will continue plan of care

## 2011-09-10 MED ORDER — IBUPROFEN 600 MG PO TABS: 600.0000 mg | ORAL_TABLET | Freq: Four times a day (QID) | ORAL | Status: AC

## 2011-09-10 MED ORDER — OXYCODONE-ACETAMINOPHEN 5-325 MG PO TABS: 1.0000 | ORAL_TABLET | ORAL | Status: AC | PRN

## 2011-09-10 NOTE — Discharge Summary (Signed)
Obstetric Discharge Summary Reason for Admission: onset of labor Prenatal Procedures: NST Intrapartum Procedures: spontaneous vaginal delivery Postpartum Procedures: cytotec Complications-Operative and Postpartum: hemorrhage mild Hemoglobin  Date Value Range Status  09/08/2011 13.6  12.0-15.0 (g/dL) Final     HCT  Date Value Range Status  09/08/2011 38.4  36.0-46.0 (%) Final   Hospital Course: Admitted at term with labor. She progressed to SVD of a female and then had a mild PPH. This was treated by Dr Adrian Blackwater. She has done well after delivery and now wants to go home.    Discharge Diagnoses: Term Pregnancy-delivered  Discharge Information: Date: 09/10/2011 Activity: unrestricted and pelvic rest Diet: routine Medications: Ibuprofen and Percocet Condition: stable Instructions: refer to practice specific booklet Discharge to: home Follow-up Information    Follow up with [facility]. Schedule an appointment as soon as possible for a visit in 6 weeks.   Contact information:   [address] [place] [address]          Newborn Data: Live born female  Birth Weight: 9 lb 13 oz (4451 g) APGAR: 8, 8  Home with mother Return to office in 6 weeks.  [facility] 09/10/2011, 4:46 PM

## 2011-09-10 NOTE — Discharge Instructions (Signed)
Vaginal Delivery Care After  Change your pad on each trip to the bathroom.   Wipe gently with toilet paper during your hospital stay. Always wipe from front to back. A spray bottle with warm tap water could also be used or a towelette if available.   Place your soiled pad and toilet paper in a bathroom wastebasket with a plastic bag liner.   During your hospital stay, save any clots. If you pass a clot while on the toilet, do not flush it. Also, if your vaginal flow seems excessive to you, notify nursing personnel.   The first time you get out of bed after delivery, wait for assistance from a nurse. Do not get up alone at any time if you feel weak or dizzy.   Bend and extend your ankles forcefully so that you feel the calves of your legs get hard. Do this 6 times every hour when you are in bed and awake.   Do not sit with one foot under you, dangle your legs over the edge of the bed, or maintain a position that hinders the circulation in your legs.   Many women experience after pains for 2 to 3 days after delivery. These after pains are mild uterine contractions. Ask the nurse for a pain medication if you need something for this. Sometimes breastfeeding stimulates after pains; if you find this to be true, ask for the medication  -  hour before the next feeding.   For you and your infant's protection, do not go beyond the door(s) of the obstetric unit. Do not carry your baby in your arms in the hallway. When taking your baby to and from your room, put your baby in the bassinet and push the bassinet.   Mothers may have their babies in their room as much as they desire.  Document Released: 06/18/2000 Document Revised: 06/10/2011 Document Reviewed: 05/19/2007 [employer] Patient Information 2012 [employer]. 

## 2011-09-10 NOTE — Progress Notes (Signed)
Post Partum Day 1  Katie Hurley is a 36 yo [id number] at PPD #1 after NSVD doing well.     Subjective: up ad lib, voiding, tolerating PO and + flatus She has no complaints other than slight headache and sore varicose vein on her inner right thigh at this time. Her pain is well controlled with Ibuprofen and percocet. She continues to breastfeed.  Her spouse plans to have a vasectomy for future family planning.  She would like to have her son circumcised prior to discharge.  Objective: Blood pressure 100/63, pulse 74, temperature 97.5 F (36.4 C), temperature source Oral, resp. rate 18, height 5\' 11"  (1.803 m), weight 99.791 kg (220 lb), last menstrual period 11/26/2010, SpO2 96.00%, unknown if currently breastfeeding.  Physical Exam:  General: alert, cooperative, appears stated age and no distress Lochia: appropriate Uterine Fundus: firm and approximately 1 cm inferior to umbilicus  Incision: none DVT Evaluation: No evidence of DVT seen on physical exam.   Basename 09/08/11 2025  HGB 13.6  HCT 38.4    Assessment Katie Hurley is a 36 yo [id number] at PPD #1 after NSVD  Plan Plan for discharge tomorrow and Circumcision prior to discharge.  Will discuss plan with OB team.   LOS: 2 days   Mardene Speak 09/10/2011, 9:04 AM

## 2011-09-10 NOTE — Progress Notes (Signed)
Patient seen and examined, agree with note above. Patient desires circumcision for her female infant.  Circumcision procedure details discussed, risks and benefits of procedure were also discussed.  These include but are not limited to: Benefits of circumcision in men include reduction in the rates of urinary tract infection (UTI), penile cancer, some sexually transmitted infections, penile inflammatory and retractile disorders, as well as easier hygiene.  Risks include bleeding , infection, injury of glans which may lead to penile deformity or urinary tract issues, unsatisfactory cosmetic appearance and other potential complications related to the procedure.  It was emphasized that this is an elective procedure.  Patient wants to proceed with circumcision; written informed consent obtained.  Will do circumcision soon, routine circumcision and post circumcision care ordered for the infant.  Will continue routine postpartum care for patient, plan for discharge either later today or tomorrow.  Jaynie Collins, M.D. 09/10/2011 9:43 AM

## 2011-09-10 NOTE — Progress Notes (Signed)
Pt medicated with a percocet.

## 2011-09-11 NOTE — Discharge Summary (Signed)
Attestation of Attending Supervision of Advanced Practitioner: Evaluation and management procedures were performed by the PA/NP/CNM/OB Fellow under my supervision/collaboration. Chart reviewed, and agree with management and plan.  Jaynie Collins, M.D. 09/11/2011 12:07 PM

## 2011-11-25 ENCOUNTER — Encounter: Payer: Self-pay | Admitting: *Deleted

## 2012-12-01 ENCOUNTER — Other Ambulatory Visit: Payer: Self-pay

## 2012-12-13 ENCOUNTER — Ambulatory Visit: Payer: Commercial Managed Care - PPO | Admitting: Obstetrics & Gynecology

## 2013-04-18 ENCOUNTER — Encounter: Payer: Self-pay | Admitting: Obstetrics & Gynecology

## 2013-04-18 ENCOUNTER — Ambulatory Visit (INDEPENDENT_AMBULATORY_CARE_PROVIDER_SITE_OTHER): Payer: Commercial Managed Care - PPO | Admitting: Obstetrics & Gynecology

## 2013-04-18 VITALS — BP 118/72 | HR 70 | Resp 16 | Ht 71.0 in | Wt 181.0 lb

## 2013-04-18 DIAGNOSIS — N949 Unspecified condition associated with female genital organs and menstrual cycle: Secondary | ICD-10-CM

## 2013-04-18 DIAGNOSIS — R102 Pelvic and perineal pain: Secondary | ICD-10-CM

## 2013-04-18 DIAGNOSIS — N920 Excessive and frequent menstruation with regular cycle: Secondary | ICD-10-CM

## 2013-04-18 NOTE — Progress Notes (Signed)
  Subjective:    Patient ID: Katie Hurley, female    DOB: 1976-01-13, 37 y.o.   MRN: [id number]  HPI  37 yo MW P6P4A2 (7,5,2,1) here because of pelvic pain, randomly, lasts for a couple days and then spontaneously resolves. She tries IBU 400 mg with some relief. Pain is not worse with her periods, doesn't seem to be related to sex. She has some dyschezia, but this is different and she has seen a GI. Her pelvic pain started a month or two ago (August). She reports that her periods are VERY heavy and she has been treated for anemia in past. Her periods are somewhat painful. She uses condoms for birth control.  Review of Systems Pap smear normal at [facility] 5/14    Objective:   Physical Exam  Normal vulva and vagina There is uterosacral nodularity NSSR, non- mobile, NT Normal adnexal exam      Assessment & Plan:  New onset pelvic pain- cervical cultures and gyn u/s. Possible endometriosis with uterosacral nodularity on exam Preventative care- She declines a flu vaccine Menorrhagia- check CBC

## 2013-04-18 NOTE — Patient Instructions (Signed)
Endometriosis  Endometriosis is a disease that occurs when the endometrium (lining of the uterus) is misplaced outside of its normal location. It may occur in many locations close to the uterus (womb), but commonly on the ovaries, fallopian tubes, vagina (birth canal) and bowel located close to the uterus. Because the uterus sloughs (expels) its lining every month (menses), there is bleeding whereever the endometrial tissue is located.  SYMPTOMS   Often there are no symptoms. However, because blood is irritating to tissues not normally exposed to it, when symptoms occur they vary with the location of the misplaced endometrium. Symptoms often include back and abdominal pain. Periods may be heavier and intercourse may be painful. Infertility may be present. You may have all of these symptoms at one time or another or you may have months with no symptoms at all. Although the symptoms occur mainly during menses, they can occur mid-cycle as well, and usually terminate with menopause.  DIAGNOSIS   Your caregiver may recommend a blood test and urine test (urinalysis) to help rule out other conditions. Another common test is ultrasound, a painless procedure that uses sound waves to make a sonogram "picture" of abnormal tissue that could be endometriosis. If your bowel movements are painful around your periods, your caregiver may advise a barium enema (an X-ray of the lower bowel), to try to find the source of your pain. This is sometimes confirmed by laparoscopy. Laparoscopy is a procedure where your caregiver looks into your abdomen with a laparoscope (a small pencil sized telescope). Your caregiver may take a tiny piece of tissue (biopsy) from any abnormal tissue to confirm or document your problem. These tissues are sent to the lab and a pathologist looks at them under the microscope to give a microscopic diagnosis.  TREATMENT   Once the diagnosis is made, it can be treated by destruction of the misplaced endometrial  tissue using heat (diathermy), laser, cutting (excision), or chemical means. It may also be treated with hormonal therapy. When using hormonal therapy menses are eliminated, therefore eliminating the monthly exposure to blood by the misplaced endometrial tissue. Only in severe cases is it necessary to perform a hysterectomy with removal of the tubes, uterus and ovaries.  HOME CARE INSTRUCTIONS    Only take over-the-counter or prescription medicines for pain, discomfort, or fever as directed by your caregiver.   Avoid activities that produce pain, including a physical sexual relationship.   Do not take aspirin as this may increase bleeding when not on hormonal therapy.   See your caregiver for pain or problems not controlled with treatment.  SEEK IMMEDIATE MEDICAL CARE IF:    Your pain is severe and is not responding to pain medicine.   You develop severe nausea and vomiting, or you cannot keep foods down.   Your pain localizes to the right lower part of your abdomen (possible appendicitis).   You have swelling or increasing pain in the abdomen.   You have a fever.   You see blood in your stool.  MAKE SURE YOU:    Understand these instructions.   Will watch your condition.   Will get help right away if you are not doing well or get worse.  Document Released: 06/18/2000 Document Revised: 09/13/2011 Document Reviewed: 02/07/2008  ExitCare Patient Information 2014 [employer].

## 2013-04-19 ENCOUNTER — Ambulatory Visit
Admission: RE | Admit: 2013-04-19 | Discharge: 2013-04-19 | Disposition: A | Discharge status: Home / Self Care | Payer: Commercial Managed Care - PPO | Source: Ambulatory Visit | Attending: Obstetrics & Gynecology | Admitting: Obstetrics & Gynecology

## 2013-04-19 DIAGNOSIS — R102 Pelvic and perineal pain: Secondary | ICD-10-CM

## 2013-04-19 LAB — CBC
HCT: 41.7 % (ref 36.0–46.0)
MCHC: 34.3 g/dL (ref 30.0–36.0)
MCV: 88.5 fL (ref 78.0–100.0)
RDW: 14 % (ref 11.5–15.5)

## 2013-04-19 LAB — GC/CHLAMYDIA PROBE AMP, URINE: GC Probe Amp, Urine: NEGATIVE

## 2013-04-26 ENCOUNTER — Encounter: Payer: Self-pay | Admitting: Obstetrics & Gynecology

## 2013-04-26 ENCOUNTER — Ambulatory Visit (INDEPENDENT_AMBULATORY_CARE_PROVIDER_SITE_OTHER): Payer: Commercial Managed Care - PPO | Admitting: Obstetrics & Gynecology

## 2013-04-26 VITALS — BP 142/90 | HR 87 | Resp 16 | Ht 71.0 in | Wt 182.0 lb

## 2013-04-26 DIAGNOSIS — Z01812 Encounter for preprocedural laboratory examination: Secondary | ICD-10-CM

## 2013-04-26 DIAGNOSIS — N920 Excessive and frequent menstruation with regular cycle: Secondary | ICD-10-CM

## 2013-04-26 NOTE — Progress Notes (Signed)
  Subjective:    Patient ID: Katie Hurley, female    DOB: September 14, 1975, 37 y.o.   MRN: [id number]  HPI  She is here today for an [facility] due to her menorrhagia. U/S showed a thickened endometrium of 23 mm. She is having her period today. Her hemoglobin was 14.3.  Review of Systems     Objective:   Physical Exam   UPT negative, consent signed, time out done Cervix prepped with betadine and grasped with a single tooth tenaculum Uterus sounded to 9 cm Pipelle used for 3 passes with a moderate amount of tissue obtained. She tolerated the procedure well.       Assessment & Plan:  Menorrhagia- RTC 2 weeks

## 2013-05-01 ENCOUNTER — Other Ambulatory Visit: Payer: Commercial Managed Care - PPO | Admitting: Obstetrics & Gynecology

## 2013-05-03 ENCOUNTER — Telehealth: Payer: Self-pay | Admitting: *Deleted

## 2013-05-03 NOTE — Telephone Encounter (Signed)
Pt notified of neg Endo biopsy.

## 2013-05-09 ENCOUNTER — Ambulatory Visit (INDEPENDENT_AMBULATORY_CARE_PROVIDER_SITE_OTHER): Payer: Commercial Managed Care - PPO | Admitting: Obstetrics & Gynecology

## 2013-05-09 ENCOUNTER — Encounter: Payer: Self-pay | Admitting: Obstetrics & Gynecology

## 2013-05-09 VITALS — BP 116/74 | HR 79 | Resp 16 | Ht 71.0 in

## 2013-05-09 DIAGNOSIS — N938 Other specified abnormal uterine and vaginal bleeding: Secondary | ICD-10-CM

## 2013-05-09 DIAGNOSIS — N949 Unspecified condition associated with female genital organs and menstrual cycle: Secondary | ICD-10-CM

## 2013-05-09 LAB — TSH: TSH: 1.621 u[IU]/mL (ref 0.350–4.500)

## 2013-05-09 NOTE — Progress Notes (Signed)
  Subjective:    Patient ID: Katie Hurley, female    DOB: [birth year], 37 y.o.   MRN: [id number]  HPI  37 yo MW P4 here for f/u of EMBX for DUB. Her hbg was 14, US showed thickened endometrium but otherwise normal. Uses condoms for contraception. She tells me that her last 2 periods have been much lighter.  Review of Systems     Objective:   Physical Exam        Assessment & Plan:  H/o menorrhagia, but recently normal. I have offered watchful wait, OCPs, Mirena, endometrial ablation/d&c. She will consider her options and let me know. I will check a TSH today.

## 2013-05-09 NOTE — Patient Instructions (Signed)

## 2013-05-10 ENCOUNTER — Other Ambulatory Visit: Payer: Self-pay

## 2013-05-10 ENCOUNTER — Telehealth: Payer: Self-pay | Admitting: *Deleted

## 2013-05-10 NOTE — Telephone Encounter (Signed)
Called pt to adv TSH was WNL

## 2013-12-11 ENCOUNTER — Ambulatory Visit (INDEPENDENT_AMBULATORY_CARE_PROVIDER_SITE_OTHER): Payer: BC Managed Care – PPO | Admitting: Obstetrics & Gynecology

## 2013-12-11 ENCOUNTER — Encounter: Payer: Self-pay | Admitting: Obstetrics & Gynecology

## 2013-12-11 VITALS — BP 115/71 | HR 74 | Resp 16 | Ht 71.0 in | Wt 182.0 lb

## 2013-12-11 DIAGNOSIS — N926 Irregular menstruation, unspecified: Secondary | ICD-10-CM

## 2013-12-11 DIAGNOSIS — N898 Other specified noninflammatory disorders of vagina: Secondary | ICD-10-CM

## 2013-12-11 MED ORDER — NORETHIN ACE-ETH ESTRAD-FE 1-20 MG-MCG(24) PO TABS: 1.0000 | ORAL_TABLET | Freq: Every day | ORAL | Status: DC

## 2013-12-12 LAB — WET PREP BY MOLECULAR PROBE
CANDIDA SPECIES: NEGATIVE
GARDNERELLA VAGINALIS: NEGATIVE
TRICHOMONAS VAG: NEGATIVE

## 2013-12-12 LAB — CBC
HCT: 38.1 % (ref 36.0–46.0)
HEMOGLOBIN: 12.6 g/dL (ref 12.0–15.0)
MCH: 27.8 pg (ref 26.0–34.0)
MCHC: 33.1 g/dL (ref 30.0–36.0)
MCV: 83.9 fL (ref 78.0–100.0)
PLATELETS: 332 10*3/uL (ref 150–400)
RBC: 4.54 MIL/uL (ref 3.87–5.11)
RDW: 16.5 % — AB (ref 11.5–15.5)
WBC: 8 10*3/uL (ref 4.0–10.5)

## 2013-12-12 NOTE — Progress Notes (Signed)
   Subjective:    Patient ID: Katie Hurley, female    DOB: [birth year], 38 y.o.   MRN: [id number]  HPI  Pt presents for exam and multiple menstrual related complaints.  Pt feels that she is bloated mid cycle, she has moodiness the 1-2 weeks prior to cycle, and having heavier cycles than she is used to.  Pt is not on birth control and has only used OCPs once in her life.  She thought they caused ankle swelling.    Pt also complaining of rash on left forearm and bruisiing on her left tricep.  I do not feel these are related to her menstrual cycle.  Review of Systems  Gastrointestinal:       Bloating mid cycle  Genitourinary: Positive for vaginal discharge and menstrual problem.  Psychiatric/Behavioral:       Moodiness near menses       Objective:   Physical Exam  Vitals reviewed. Constitutional: She is oriented to person, place, and time. She appears well-developed and well-nourished. No distress.  HENT:  Head: Normocephalic and atraumatic.  Eyes: Conjunctivae are normal.  Cardiovascular: Normal rate and regular rhythm.   Pulmonary/Chest: Effort normal and breath sounds normal.  Abdominal: Soft. She exhibits no distension and no mass. There is no tenderness. There is no rebound and no guarding.  Genitourinary: Uterus normal. Vaginal discharge found.  Musculoskeletal: She exhibits no edema.  Neurological: She is alert and oriented to person, place, and time.  Skin: Skin is warm and dry.  Psychiatric: She has a normal mood and affect.          Assessment & Plan:  38 yo female with PMDD and heavy menses  1- check cbc 2-suggest OCPS as this will help with most of her complaints. 3-Pap not due this year 4-wet prep 5-RTC 3 months to see if her symptoms were relieved.

## 2013-12-13 ENCOUNTER — Telehealth: Payer: Self-pay | Admitting: *Deleted

## 2013-12-13 NOTE — Telephone Encounter (Signed)
LM on voicemail of normal labs 

## 2014-05-06 ENCOUNTER — Encounter: Payer: Self-pay | Admitting: Obstetrics & Gynecology

## 2014-12-19 ENCOUNTER — Encounter: Payer: Self-pay | Admitting: Obstetrics & Gynecology

## 2014-12-19 ENCOUNTER — Ambulatory Visit (INDEPENDENT_AMBULATORY_CARE_PROVIDER_SITE_OTHER): Payer: Managed Care, Other (non HMO) | Admitting: Obstetrics & Gynecology

## 2014-12-19 VITALS — BP 105/64 | HR 69 | Resp 16 | Ht 71.0 in | Wt 177.0 lb

## 2014-12-19 DIAGNOSIS — N940 Mittelschmerz: Secondary | ICD-10-CM | POA: Diagnosis not present

## 2014-12-19 DIAGNOSIS — Z0000 Encounter for general adult medical examination without abnormal findings: Secondary | ICD-10-CM

## 2014-12-19 DIAGNOSIS — Z124 Encounter for screening for malignant neoplasm of cervix: Secondary | ICD-10-CM | POA: Diagnosis not present

## 2014-12-19 DIAGNOSIS — Z01419 Encounter for gynecological examination (general) (routine) without abnormal findings: Secondary | ICD-10-CM

## 2014-12-19 DIAGNOSIS — Z1151 Encounter for screening for human papillomavirus (HPV): Secondary | ICD-10-CM

## 2014-12-19 NOTE — Progress Notes (Signed)
Subjective:    Katie Hurley is a 39 y.o. MW P4 (24,7,4,3 yo kids)  female who presents for an annual exam. The patient has no complaints today except for mid month pain, every month. The patient is sexually active. GYN screening history: last pap: was normal. The patient wears seatbelts: yes. The patient participates in regular exercise: yes. Has the patient ever been transfused or tattooed?: no. The patient reports that there is not domestic violence in her life.   Menstrual History: OB History    Gravida Para Term Preterm AB TAB SAB Ectopic Multiple Living   6 4 4  2  2   4       Menarche age: 82  Patient's last menstrual period was 12/04/2014.    The following portions of the patient's history were reviewed and updated as appropriate: allergies, current medications, past family history, past medical history, past social history, past surgical history and problem list.  Review of Systems A comprehensive review of systems was negative. Married for about 11 years, homemaker.   Objective:    BP 105/64 mmHg  Pulse 69  Resp 16  Ht 5\' 11"  (1.803 m)  Wt 177 lb (80.287 kg)  BMI 24.70 kg/m2  LMP 12/04/2014  General Appearance:    Alert, cooperative, no distress, appears stated age  Head:    Normocephalic, without obvious abnormality, atraumatic  Eyes:    PERRL, conjunctiva/corneas clear, EOM's intact, fundi    benign, both eyes  Ears:    Normal TM's and external ear canals, both ears  Nose:   Nares normal, septum midline, mucosa normal, no drainage    or sinus tenderness  Throat:   Lips, mucosa, and tongue normal; teeth and gums normal  Neck:   Supple, symmetrical, trachea midline, no adenopathy;    thyroid:  no enlargement/tenderness/nodules; no carotid   bruit or JVD  Back:     Symmetric, no curvature, ROM normal, no CVA tenderness  Lungs:     Clear to auscultation bilaterally, respirations unlabored  Chest Wall:    No tenderness or deformity   Heart:    Regular rate and rhythm,  S1 and S2 normal, no murmur, rub   or gallop  Breast Exam:    No tenderness, masses, or nipple abnormality  Abdomen:     Soft, non-tender, bowel sounds active all four quadrants,    no masses, no organomegaly  Genitalia:    Normal female without lesion, discharge or tenderness, NSSR, NT, mobile, no adnexal masses or tenderness     Extremities:   Extremities normal, atraumatic, no cyanosis or edema  Pulses:   2+ and symmetric all extremities  Skin:   Skin color, texture, turgor normal, no rashes or lesions  Lymph nodes:   Cervical, supraclavicular, and axillary nodes normal  Neurologic:   CNII-XII intact, normal strength, sensation and reflexes    throughout  .    Assessment:    Healthy female exam.   [place] pain Plan:     Breast self exam technique reviewed and patient encouraged to perform self-exam monthly. Thin prep Pap smear.   Offered OCPs for ovulation pain but she declines at this time

## 2014-12-23 LAB — CYTOLOGY - PAP

## 2015-04-13 IMAGING — US US TRANSVAGINAL NON-OB
1 series · 14 of 25 positions shown · non-contrast
Comparison: None

CLINICAL DATA: Pelvic pain.  Uterosacral nodularity



[Series 1: us transvaginal non-ob · 0.31mm/px · 14 of 67 slices shown]
[im 1/67]
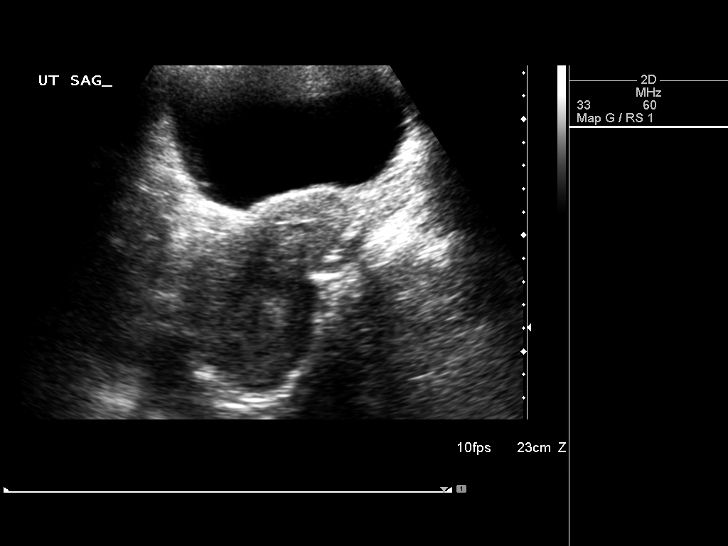
[im 6/67]
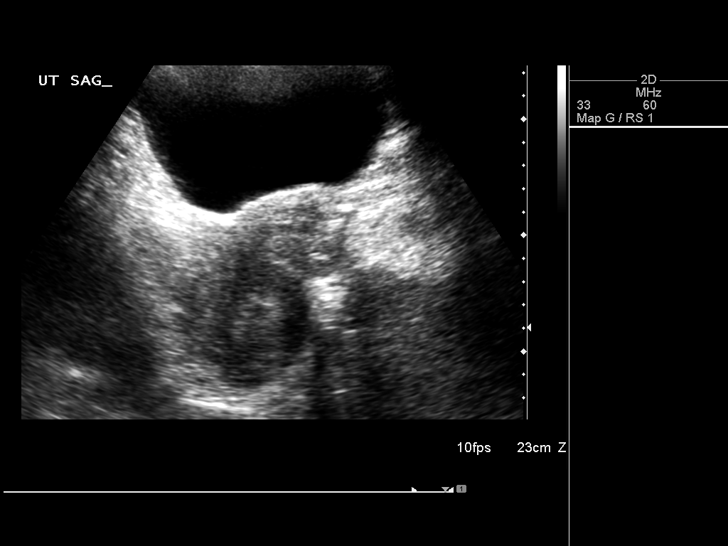
[im 12/67]
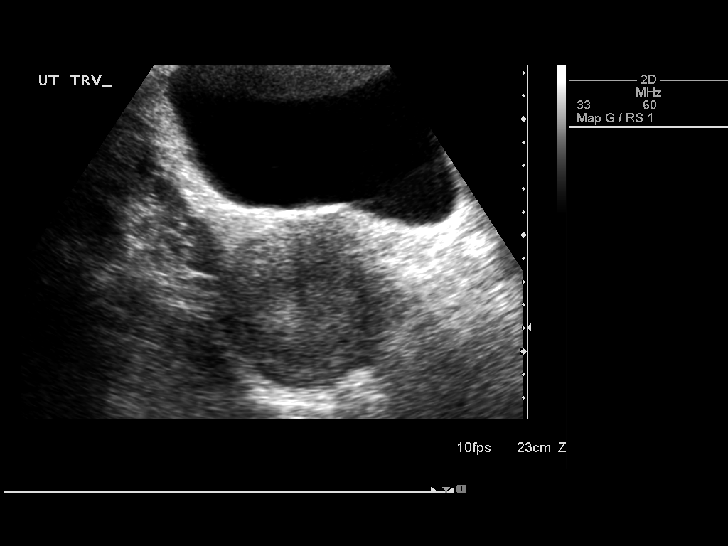
[im 17/67]
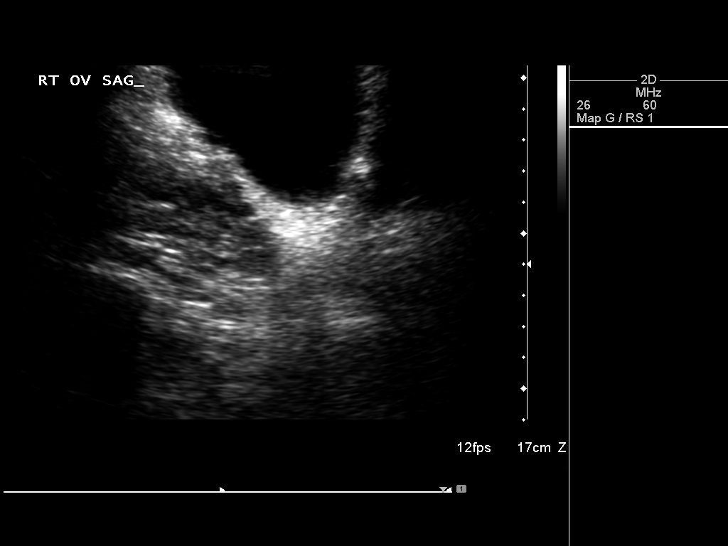
[im 23/67]
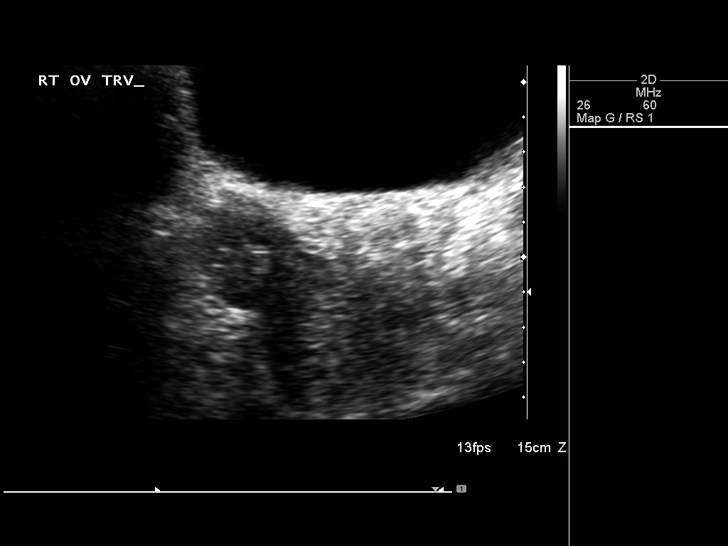
[im 25/67]
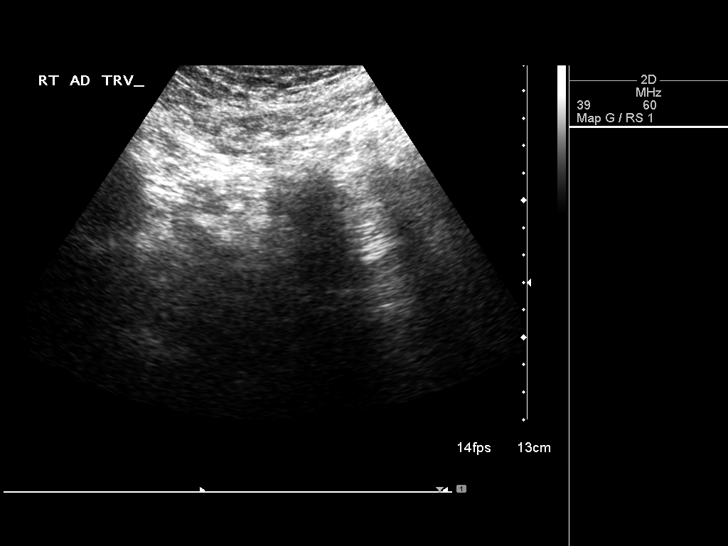
[im 31/67]
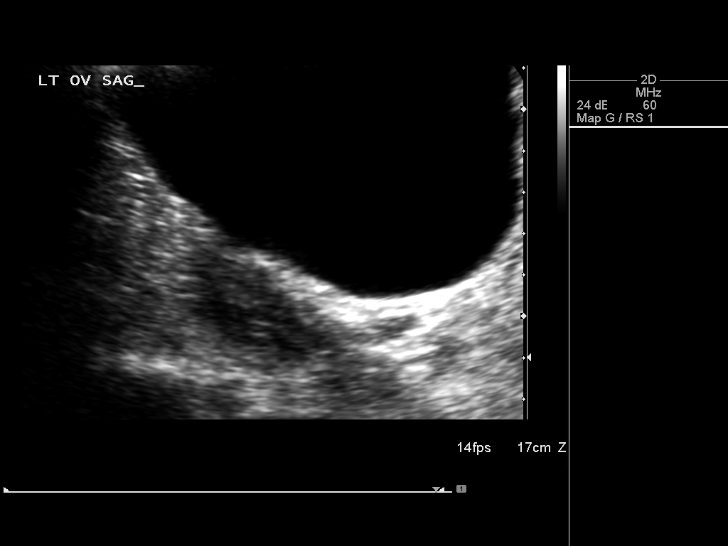
[im 36/67]
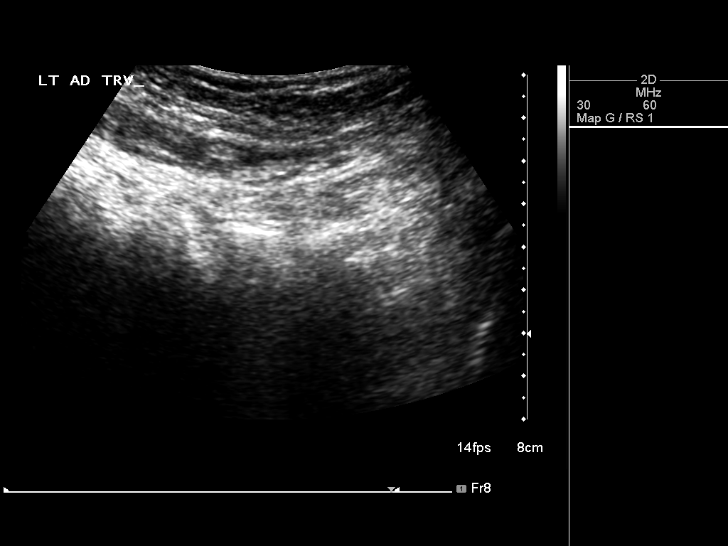
[im 42/67]
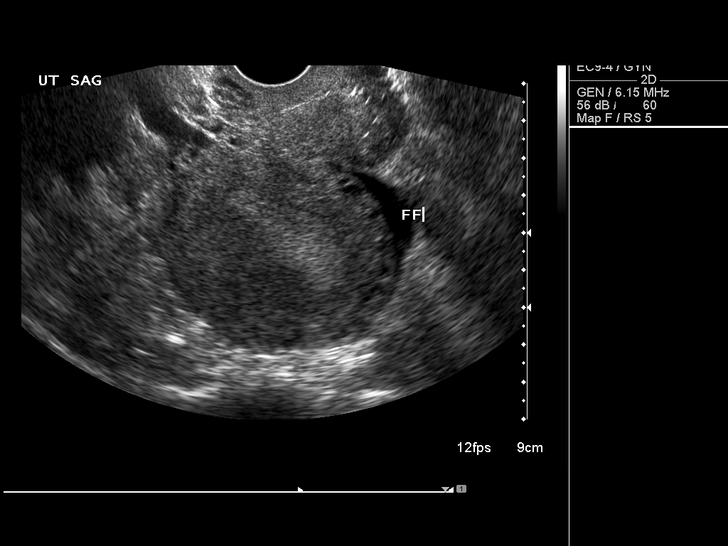
[im 45/67]
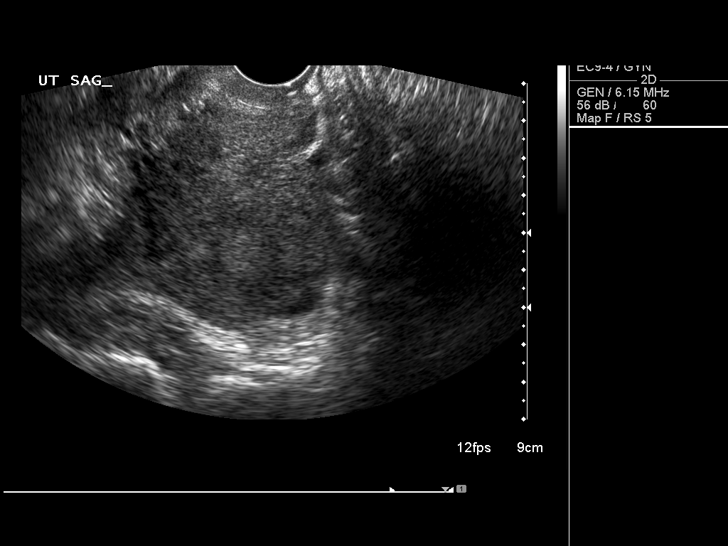
[im 50/67]
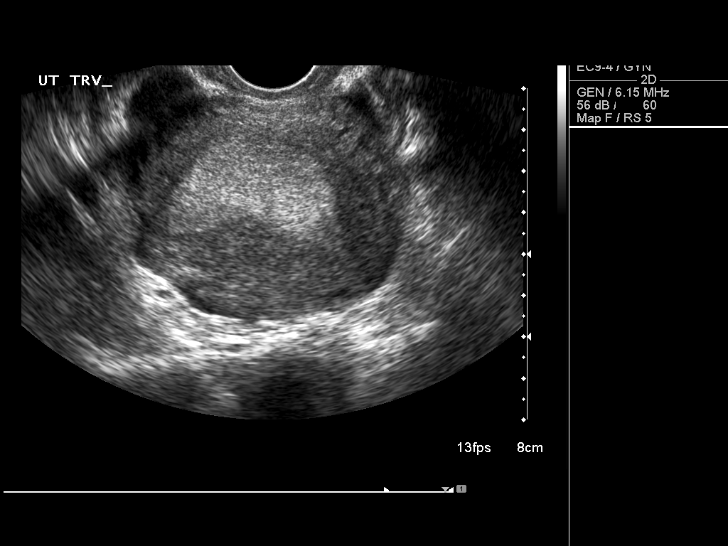
[im 56/67]
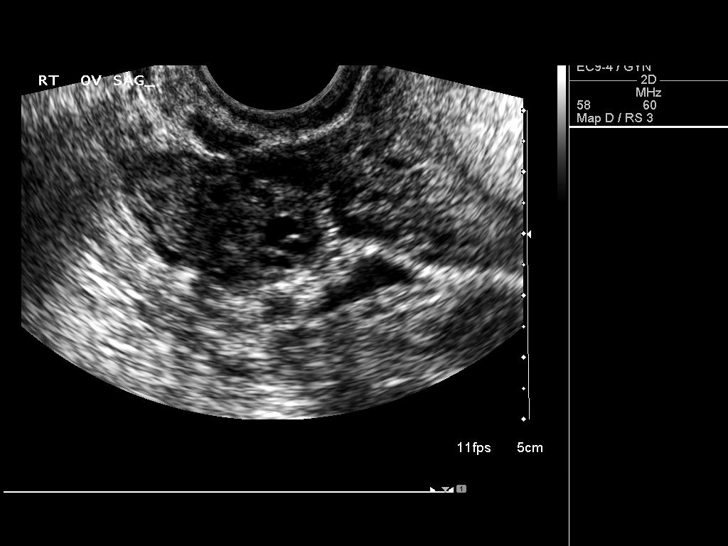
[im 61/67]
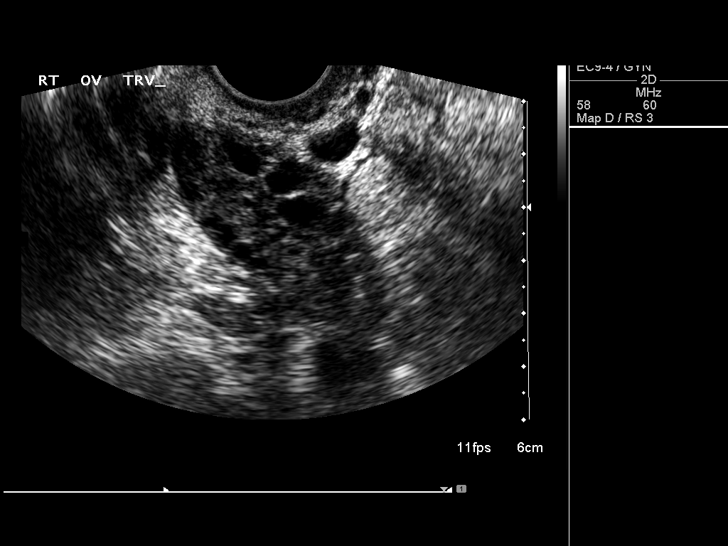
[im 67/67]
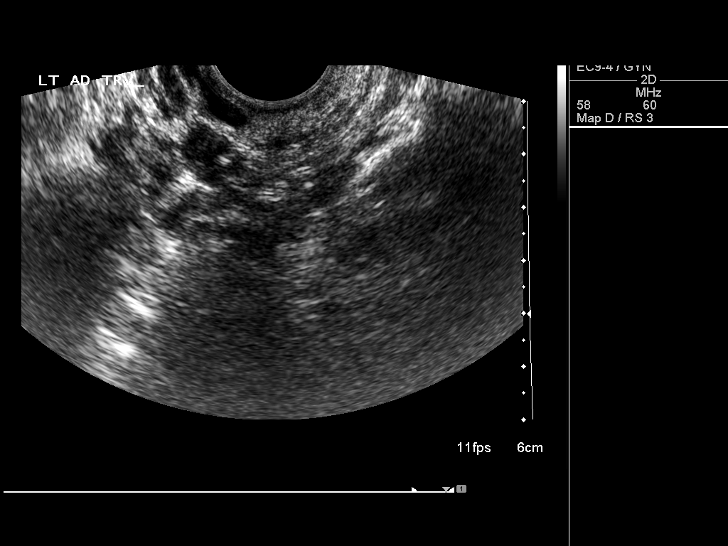

[14 of 25 positions shown; findings below may reference images not displayed]

FINDINGS: Uterus

Measurements: 10.0 x 5.6 x 7.2 cm. Uterus is retroverted. No
fibroids or other mass visualized.

Endometrium

Thickness: Thickened at 23 mm..  No focal abnormality visualized.

Right ovary

Measurements: 3.5 x 2.1 x 2.6 cm.. Normal appearance/no adnexal
mass.

Left ovary

Measurements: 3.6 x 2.5 x 2.3 cm. Normal appearance/no adnexal mass.

Other findings

Trace free fluid in the pelvis.
IMPRESSION: Thickened endometrium without focal abnormality. Recommend followup
ultrasound in 1-2 cycles to assure resolution

## 2015-04-27 ENCOUNTER — Encounter (HOSPITAL_BASED_OUTPATIENT_CLINIC_OR_DEPARTMENT_OTHER): Payer: Self-pay | Admitting: *Deleted

## 2015-04-27 ENCOUNTER — Emergency Department (HOSPITAL_BASED_OUTPATIENT_CLINIC_OR_DEPARTMENT_OTHER)
Admission: EM | Admit: 2015-04-27 | Discharge: 2015-04-28 | Disposition: A | Discharge status: Home / Self Care | Payer: Managed Care, Other (non HMO) | Attending: Emergency Medicine | Admitting: Emergency Medicine

## 2015-04-27 DIAGNOSIS — R51 Headache: Secondary | ICD-10-CM

## 2015-04-27 DIAGNOSIS — Z79899 Other long term (current) drug therapy: Secondary | ICD-10-CM | POA: Diagnosis not present

## 2015-04-27 DIAGNOSIS — Z8679 Personal history of other diseases of the circulatory system: Secondary | ICD-10-CM | POA: Insufficient documentation

## 2015-04-27 DIAGNOSIS — Z3202 Encounter for pregnancy test, result negative: Secondary | ICD-10-CM | POA: Insufficient documentation

## 2015-04-27 DIAGNOSIS — J0110 Acute frontal sinusitis, unspecified: Secondary | ICD-10-CM | POA: Diagnosis not present

## 2015-04-27 DIAGNOSIS — G8929 Other chronic pain: Secondary | ICD-10-CM | POA: Insufficient documentation

## 2015-04-27 DIAGNOSIS — R519 Headache, unspecified: Secondary | ICD-10-CM

## 2015-04-27 DIAGNOSIS — R1110 Vomiting, unspecified: Secondary | ICD-10-CM | POA: Diagnosis present

## 2015-04-27 LAB — URINALYSIS, ROUTINE W REFLEX MICROSCOPIC
BILIRUBIN URINE: NEGATIVE
GLUCOSE, UA: NEGATIVE mg/dL
HGB URINE DIPSTICK: NEGATIVE
Ketones, ur: >80 mg/dL — AB
Leukocytes, UA: NEGATIVE
Nitrite: NEGATIVE
PH: 7 (ref 5.0–8.0)
Protein, ur: NEGATIVE mg/dL
Specific Gravity, Urine: 1.026 (ref 1.005–1.030)
UROBILINOGEN UA: 0.2 mg/dL (ref 0.0–1.0)

## 2015-04-27 LAB — PREGNANCY, URINE: Preg Test, Ur: NEGATIVE

## 2015-04-27 MED ORDER — DIPHENHYDRAMINE HCL 50 MG/ML IJ SOLN
25.0000 mg | Freq: Once | INTRAMUSCULAR | Status: AC
  Administered 2015-04-28: 25 mg via INTRAVENOUS
  Filled 2015-04-27: qty 1

## 2015-04-27 MED ORDER — DEXAMETHASONE SODIUM PHOSPHATE 10 MG/ML IJ SOLN
10.0000 mg | Freq: Once | INTRAMUSCULAR | Status: AC
  Administered 2015-04-28: 10 mg via INTRAVENOUS
  Filled 2015-04-27: qty 1

## 2015-04-27 MED ORDER — AMOXICILLIN 500 MG PO CAPS
500.0000 mg | ORAL_CAPSULE | Freq: Once | ORAL | Status: AC
  Administered 2015-04-27: 500 mg via ORAL

## 2015-04-27 MED ORDER — OXYMETAZOLINE HCL 0.05 % NA SOLN
1.0000 | Freq: Once | NASAL | Status: AC
  Administered 2015-04-27: 1 via NASAL
  Filled 2015-04-27: qty 15

## 2015-04-27 MED ORDER — ONDANSETRON HCL 4 MG/2ML IJ SOLN
4.0000 mg | Freq: Once | INTRAMUSCULAR | Status: AC
  Administered 2015-04-27: 4 mg via INTRAVENOUS
  Filled 2015-04-27: qty 2

## 2015-04-27 MED ORDER — KETOROLAC TROMETHAMINE 30 MG/ML IJ SOLN
30.0000 mg | Freq: Once | INTRAMUSCULAR | Status: AC
  Administered 2015-04-27: 30 mg via INTRAVENOUS
  Filled 2015-04-27: qty 1

## 2015-04-27 MED ORDER — AMOXICILLIN 250 MG PO CAPS
250.0000 mg | ORAL_CAPSULE | Freq: Once | ORAL | Status: DC
  Filled 2015-04-27: qty 1

## 2015-04-27 MED ORDER — ACETAMINOPHEN 325 MG PO TABS
650.0000 mg | ORAL_TABLET | Freq: Once | ORAL | Status: AC
  Administered 2015-04-27: 650 mg via ORAL
  Filled 2015-04-27: qty 2

## 2015-04-27 MED ORDER — AMOXICILLIN 500 MG PO CAPS
ORAL_CAPSULE | ORAL | Status: AC
  Administered 2015-04-27: 500 mg via ORAL
  Filled 2015-04-27: qty 1

## 2015-04-27 NOTE — ED Notes (Signed)
Body aches, N/V, URI x 6 days.

## 2015-04-27 NOTE — ED Notes (Signed)
Patient administered fluids successfully.

## 2015-04-27 NOTE — ED Provider Notes (Signed)
CSN: [id number]     Arrival date & time 04/27/15  1926 History  By signing my name below, I, Ronney Lion, attest that this documentation has been prepared under the direction and in the presence of Derwood Kaplan, MD. Electronically Signed: Ronney Lion, ED Scribe. 04/27/2015. 9:37 PM.    Chief Complaint  Patient presents with  . Emesis   The history is provided by the patient. No language interpreter was used.    HPI Comments: Katie Hurley is a 39 y.o. female who presents to the Emergency Department complaining of moderate nausea and 3 episodes of vomiting that began today. She also complains of an associated moderate, aching headache that began today. She states her symptoms began with a sinus congestion with yellow nasal congestion, as well as generalized myalgias and chills that began 6 days ago and worsened today. Patient states she has not eaten today and has had reduced fluid intake, so she has been mostly dry-heaving. Bending over exacerbates her headache. She had been taking Nyquil and Dayquil all week, which has provided moderate relief to her symptoms. Patient states she has had some sick contact with her children. She denies a history of DM, HTN, or any other medical conditions. Patient denies neck pain, neck stiffness, sore throat, abdominal pain, otalgia.   Past Medical History  Diagnosis Date  . Chronic headaches     Increases in pregnancy; tx with Fioricet (relief)  . Varicose veins     Right Leg  . Urinary incontinence     After 3rd baby   Past Surgical History  Procedure Laterality Date  . Wisdom tooth extraction  1992   Family History  Problem Relation Age of Onset  . Diabetes Paternal Grandmother   . Cancer Maternal Grandfather     prostate and Lung   Social History  Substance Use Topics  . Smoking status: Never Smoker   . Smokeless tobacco: Never Used  . Alcohol Use: No   OB History    Gravida Para Term Preterm AB TAB SAB Ectopic Multiple Living   Review of Systems  Constitutional: Positive for chills.  HENT: Positive for congestion and sinus pressure. Negative for ear pain and sore throat.   Gastrointestinal: Positive for nausea and vomiting. Negative for abdominal pain.  Musculoskeletal: Positive for myalgias (generalized). Negative for neck pain and neck stiffness.  Neurological: Positive for headaches.    Allergies  Erythromycin  Home Medications   Prior to Admission medications   Medication Sig Start Date End Date Taking? Authorizing Provider  acetaminophen (TYLENOL 8 HOUR) 650 MG CR tablet Take 1 tablet (650 mg total) by mouth every 8 (eight) hours as needed for pain. 04/28/15   Derwood Kaplan, MD  amoxicillin (AMOXIL) 250 MG capsule Take 1 capsule (250 mg total) by mouth 3 (three) times daily. 04/28/15   Derwood Kaplan, MD  aspirin-acetaminophen-caffeine (EXCEDRIN MIGRAINE) [id number] MG per tablet 2 tablets.    Historical Provider, MD  Calcium Carb-Cholecalciferol (CALCIUM + D3) 600-200 MG-UNIT TABS Take 1 capsule by mouth.    Historical Provider, MD  Calcium-Magnesium-Vitamin D (CALCIUM MAGNESIUM PO) Take 1 tablet by mouth.    Historical Provider, MD  FIBER SELECT GUMMIES PO Take by mouth.    Historical Provider, MD  fluticasone (FLONASE) 50 MCG/ACT nasal spray Place 1 spray into the nose. 05/09/14 05/09/15  Historical Provider, MD  Ibuprofen 200 MG CAPS 1 tablet.  Historical Provider, MD  Misc Natural Products (GERMANIUM) 10 MG CAPS 1 capsule.    Historical Provider, MD  Multiple Vitamin (MULTIVITAMIN) tablet 1 tablet.    Historical Provider, MD  ondansetron (ZOFRAN ODT) 8 MG disintegrating tablet Take 1 tablet (8 mg total) by mouth every 8 (eight) hours as needed for nausea. 04/28/15   Derwood KaplanAnkit Merissa Renwick, MD  Probiotic Product (PROBIOTIC DAILY) CAPS 1 capsule.    Historical Provider, MD  Turmeric Curcumin 500 MG CAPS Take 1 capsule by mouth daily.    Historical Provider, MD  Vitamins/Minerals TABS Take 1  capsule by mouth.    Historical Provider, MD   BP 124/76 mmHg  Pulse 87  Temp(Src) 97.6 F (36.4 C) (Oral)  Resp 18  Ht 5\' 11"  (1.803 m)  Wt 175 lb (79.379 kg)  BMI 24.42 kg/m2  SpO2 98%  LMP 04/04/2015 Physical Exam  Constitutional: She is oriented to person, place, and time. She appears well-developed and well-nourished. No distress.  HENT:  Head: Normocephalic and atraumatic.  Mouth/Throat: No trismus in the jaw. No oropharyngeal exudate.  Mild effusion behind the bilateral TMs. No posterior pharyngeal erythema. No exudates. No trismus.   Eyes: Conjunctivae and EOM are normal.  Neck: Neck supple. No tracheal deviation present.  No cervical lymphadenoapthy. No preauricular or postauricular lymphadenopathy. No nuchal rigidity or meningismus.   Cardiovascular: Normal rate and regular rhythm.   No murmur heard. Pulmonary/Chest: Effort normal. No respiratory distress. She has no wheezes. She has no rales.  Lungs are clear to auscultation bilaterally.  Abdominal: Soft. There is no tenderness.  Musculoskeletal: Normal range of motion.  Lymphadenopathy:       Head (right side): No preauricular and no posterior auricular adenopathy present.       Head (left side): No preauricular and no posterior auricular adenopathy present.    She has no cervical adenopathy.  Neurological: She is alert and oriented to person, place, and time.  Skin: Skin is warm and dry.  Psychiatric: She has a normal mood and affect. Her behavior is normal.  Nursing note and vitals reviewed.   ED Course  Procedures (including critical care time)  DIAGNOSTIC STUDIES: Oxygen Saturation is 100% on RA, normal by my interpretation.    COORDINATION OF CARE: 9:34 PM - Discussed treatment plan with pt at bedside which includes antibiotics, decongestants, nausea medication, pushing fluids, and rest. Strict return precautions given. Pt verbalized understanding and agreed to plan.   11:00 pm: PO challenge passed. Pt's  headache has improved, 5/10  Labs Review Labs Reviewed  URINALYSIS, ROUTINE W REFLEX MICROSCOPIC (NOT AT [facility]) - Abnormal; Notable for the following:    Ketones, ur >80 (*)    All other components within normal limits  PREGNANCY, URINE   MDM   Final diagnoses:  Subacute frontal sinusitis  Acute nonintractable headache, unspecified headache type    I personally performed the services described in this documentation, which was scribed in my presence. The recorded information has been reviewed and is accurate.   DDX includes: Primary headaches - including migrainous headaches, cluster headaches, tension headaches. ICH Carotid dissection Cavernous sinus thrombosis Meningitis Encephalitis Sinusitis Tumor Vascular headaches AV malformation Brain aneurysm Muscular headaches  A/P: Pt comes in with cc of headaches, nausea, emesis x 3, sinusitis like sx. She has no meningismus, she is nontoxic, and she is immunocompetent. She no focal neuro complains not on estrogen tx.  The headaches are not classic sinusitis type headaches, but i doubt they are due to  concerning etiology at this time. She will be treated with decongestant, tylenol and amoxi for sinusitis. She has had sinusitis for 1 week now, and has green phlegm, and not getting better, so antibiotics will be a good bet.   Derwood Kaplan, MD 04/28/15 [phone]

## 2015-04-28 MED ORDER — ONDANSETRON 8 MG PO TBDP: 8.0000 mg | ORAL_TABLET | Freq: Three times a day (TID) | ORAL | Status: AC | PRN

## 2015-04-28 MED ORDER — ACETAMINOPHEN ER 650 MG PO TBCR: 650.0000 mg | EXTENDED_RELEASE_TABLET | Freq: Three times a day (TID) | ORAL | Status: AC | PRN

## 2015-04-28 MED ORDER — AMOXICILLIN 250 MG PO CAPS: 250.0000 mg | ORAL_CAPSULE | Freq: Three times a day (TID) | ORAL | Status: AC

## 2015-04-28 NOTE — Discharge Instructions (Signed)
We saw you in the ER for the headache, nausea. All the results in the ER are normal. We think you have sinusitis. The headaches could be due to the sinusitis and dehydration.  The workup in the ER is not complete, and is limited to screening for life threatening and emergent conditions only, so please see a primary care doctor for further evaluation.  Please return to the ER if your symptoms worsen; you have increased pain, fevers, chills, inability to keep any medications down, confusion, seizures, neck pain or stiffness, vision complains.  Otherwise see the outpatient doctor as requested in 2-3 days.   Sinus Headache A sinus headache occurs when the paranasal sinuses become clogged or swollen. Paranasal sinuses are air pockets within the bones of the face. Sinus headaches can range from mild to severe. CAUSES A sinus headache can result from various conditions that affect the sinuses, such as:  Colds.  Sinus infections.  Allergies. SYMPTOMS The main symptom of this condition is a headache that may feel like pain or pressure in the face, forehead, ears, or upper teeth. People who have a sinus headache often have other symptoms, such as:  Congested or runny nose.  Fever.  Inability to smell. Weather changes can make symptoms worse. DIAGNOSIS This condition may be diagnosed based on:  A physical exam and medical history.  Imaging tests, such as a CT scan and MRI, to check for problems with the sinuses.  A specialist may look into the sinuses with a tool that has a camera (endoscopy). TREATMENT Treatment for this condition depends on the cause.  Sinus pain that is caused by a sinus infection may be treated with antibiotic medicine.  Sinus pain that is caused by allergies may be helped by allergy medicines (antihistamines) and medicated nasal sprays.  Sinus pain that is caused by congestion may be helped by flushing the nose and sinuses with saline solution. HOME CARE  INSTRUCTIONS  Take medicines only as directed by your health care provider.  If you were prescribed an antibiotic medicine, finish all of it even if you start to feel better.  If you have congestion, use a nasal spray to help reduce pressure.  If directed, apply a warm, moist washcloth to your face to help relieve pain. SEEK MEDICAL CARE IF:  You have headaches more than one time each week.  You have sensitivity to light or sound.  You have a fever.  You feel sick to your stomach (nauseous) or you throw up (vomit).  Your headaches do not get better with treatment. Many people think that they have a sinus headache when they actually have migraines or tension headaches. SEEK IMMEDIATE MEDICAL CARE IF:  You have vision problems.  You have sudden, severe pain in your face or head.  You have a seizure.  You are confused.  You have a stiff neck.   This information is not intended to replace advice given to you by your health care provider. Make sure you discuss any questions you have with your health care provider.   Document Released: 07/29/2004 Document Revised: 11/05/2014 Document Reviewed: 06/17/2014 Elsevier Interactive Patient Education 2016 [employer]  Sinusitis, Adult Sinusitis is redness, soreness, and inflammation of the paranasal sinuses. Paranasal sinuses are air pockets within the bones of your face. They are located beneath your eyes, in the middle of your forehead, and above your eyes. In healthy paranasal sinuses, mucus is able to drain out, and air is able to circulate through them by  way of your nose. However, when your paranasal sinuses are inflamed, mucus and air can become trapped. This can allow bacteria and other germs to grow and cause infection. Sinusitis can develop quickly and last only a short time (acute) or continue over a long period (chronic). Sinusitis that lasts for more than 12 weeks is considered chronic. CAUSES Causes of sinusitis  include:  Allergies.  Structural abnormalities, such as displacement of the cartilage that separates your nostrils (deviated septum), which can decrease the air flow through your nose and sinuses and affect sinus drainage.  Functional abnormalities, such as when the small hairs (cilia) that line your sinuses and help remove mucus do not work properly or are not present. SIGNS AND SYMPTOMS Symptoms of acute and chronic sinusitis are the same. The primary symptoms are pain and pressure around the affected sinuses. Other symptoms include:  Upper toothache.  Earache.  Headache.  Bad breath.  Decreased sense of smell and taste.  A cough, which worsens when you are lying flat.  Fatigue.  Fever.  Thick drainage from your nose, which often is green and may contain pus (purulent).  Swelling and warmth over the affected sinuses. DIAGNOSIS Your health care provider will perform a physical exam. During your exam, your health care provider may perform any of the following to help determine if you have acute sinusitis or chronic sinusitis:  Look in your nose for signs of abnormal growths in your nostrils (nasal polyps).  Tap over the affected sinus to check for signs of infection.  View the inside of your sinuses using an imaging device that has a light attached (endoscope). If your health care provider suspects that you have chronic sinusitis, one or more of the following tests may be recommended:  Allergy tests.  Nasal culture. A sample of mucus is taken from your nose, sent to a lab, and screened for bacteria.  Nasal cytology. A sample of mucus is taken from your nose and examined by your health care provider to determine if your sinusitis is related to an allergy. TREATMENT Most cases of acute sinusitis are related to a viral infection and will resolve on their own within 10 days. Sometimes, medicines are prescribed to help relieve symptoms of both acute and chronic sinusitis.  These may include pain medicines, decongestants, nasal steroid sprays, or saline sprays. However, for sinusitis related to a bacterial infection, your health care provider will prescribe antibiotic medicines. These are medicines that will help kill the bacteria causing the infection. Rarely, sinusitis is caused by a fungal infection. In these cases, your health care provider will prescribe antifungal medicine. For some cases of chronic sinusitis, surgery is needed. Generally, these are cases in which sinusitis recurs more than 3 times per year, despite other treatments. HOME CARE INSTRUCTIONS  Drink plenty of water. Water helps thin the mucus so your sinuses can drain more easily.  Use a humidifier.  Inhale steam 3-4 times a day (for example, sit in the bathroom with the shower running).  Apply a warm, moist washcloth to your face 3-4 times a day, or as directed by your health care provider.  Use saline nasal sprays to help moisten and clean your sinuses.  Take medicines only as directed by your health care provider.  If you were prescribed either an antibiotic or antifungal medicine, finish it all even if you start to feel better. SEEK IMMEDIATE MEDICAL CARE IF:  You have increasing pain or severe headaches.  You have nausea, vomiting, or drowsiness.  You have swelling around your face.  You have vision problems.  You have a stiff neck.  You have difficulty breathing.   This information is not intended to replace advice given to you by your health care provider. Make sure you discuss any questions you have with your health care provider.   Document Released: 06/21/2005 Document Revised: 07/12/2014 Document Reviewed: 07/06/2011 [employer] Interactive Patient Education [employer]

## 2015-04-28 NOTE — ED Notes (Signed)
Patient and spouse verbalizes understanding of discharge medication and follow-up.  Patient vitals are stable and she is ambulatory

## 2017-06-21 ENCOUNTER — Ambulatory Visit: Payer: Managed Care, Other (non HMO) | Admitting: Obstetrics and Gynecology
